# Patient Record
Sex: Female | Born: 1970 | Race: Black or African American | Hispanic: No | Marital: Single | Smoking: Never smoker
Health system: Southern US, Community
[De-identification: ages and names within clinical notes are randomized; demographics above are authoritative.]

## PROBLEM LIST (undated history)

## (undated) DIAGNOSIS — R000 Tachycardia, unspecified: Secondary | ICD-10-CM

## (undated) DIAGNOSIS — K219 Gastro-esophageal reflux disease without esophagitis: Secondary | ICD-10-CM

## (undated) DIAGNOSIS — F32A Depression, unspecified: Secondary | ICD-10-CM

## (undated) DIAGNOSIS — I10 Essential (primary) hypertension: Secondary | ICD-10-CM

## (undated) DIAGNOSIS — G4730 Sleep apnea, unspecified: Secondary | ICD-10-CM

## (undated) DIAGNOSIS — E119 Type 2 diabetes mellitus without complications: Secondary | ICD-10-CM

## (undated) DIAGNOSIS — F419 Anxiety disorder, unspecified: Secondary | ICD-10-CM

## (undated) DIAGNOSIS — Z8719 Personal history of other diseases of the digestive system: Secondary | ICD-10-CM

## (undated) DIAGNOSIS — D649 Anemia, unspecified: Secondary | ICD-10-CM

## (undated) DIAGNOSIS — T84310A Breakdown (mechanical) of electronic bone stimulator, initial encounter: Secondary | ICD-10-CM

## (undated) DIAGNOSIS — G2581 Restless legs syndrome: Secondary | ICD-10-CM

## (undated) DIAGNOSIS — D497 Neoplasm of unspecified behavior of endocrine glands and other parts of nervous system: Secondary | ICD-10-CM

## (undated) DIAGNOSIS — M1990 Unspecified osteoarthritis, unspecified site: Secondary | ICD-10-CM

## (undated) DIAGNOSIS — F4310 Post-traumatic stress disorder, unspecified: Secondary | ICD-10-CM

## (undated) DIAGNOSIS — F329 Major depressive disorder, single episode, unspecified: Secondary | ICD-10-CM

## (undated) DIAGNOSIS — E039 Hypothyroidism, unspecified: Secondary | ICD-10-CM

## (undated) DIAGNOSIS — G43909 Migraine, unspecified, not intractable, without status migrainosus: Secondary | ICD-10-CM

## (undated) HISTORY — PX: HEEL SPUR SURGERY: SHX665

## (undated) HISTORY — PX: TRIGGER FINGER RELEASE: SHX641

## (undated) HISTORY — PX: ABDOMINAL HYSTERECTOMY: SHX81

## (undated) HISTORY — PX: CARPAL TUNNEL RELEASE: SHX101

---

## 2000-07-19 DIAGNOSIS — D352 Benign neoplasm of pituitary gland: Secondary | ICD-10-CM | POA: Insufficient documentation

## 2003-04-17 DIAGNOSIS — R519 Headache, unspecified: Secondary | ICD-10-CM | POA: Insufficient documentation

## 2003-10-12 ENCOUNTER — Emergency Department: Payer: Self-pay | Admitting: Emergency Medicine

## 2003-10-12 ENCOUNTER — Other Ambulatory Visit: Payer: Self-pay

## 2004-08-12 ENCOUNTER — Other Ambulatory Visit: Payer: Self-pay

## 2004-08-12 ENCOUNTER — Emergency Department: Payer: Self-pay | Admitting: Emergency Medicine

## 2005-01-04 HISTORY — PX: BREAST REDUCTION SURGERY: SHX8

## 2005-06-27 ENCOUNTER — Other Ambulatory Visit: Payer: Self-pay

## 2005-06-27 ENCOUNTER — Emergency Department: Payer: Self-pay | Admitting: Emergency Medicine

## 2005-08-14 ENCOUNTER — Emergency Department: Payer: Self-pay | Admitting: Emergency Medicine

## 2005-08-14 ENCOUNTER — Other Ambulatory Visit: Payer: Self-pay

## 2006-09-16 DIAGNOSIS — I10 Essential (primary) hypertension: Secondary | ICD-10-CM | POA: Insufficient documentation

## 2006-09-16 DIAGNOSIS — E039 Hypothyroidism, unspecified: Secondary | ICD-10-CM | POA: Insufficient documentation

## 2006-11-03 DIAGNOSIS — F4310 Post-traumatic stress disorder, unspecified: Secondary | ICD-10-CM | POA: Insufficient documentation

## 2006-11-03 DIAGNOSIS — G4489 Other headache syndrome: Secondary | ICD-10-CM | POA: Insufficient documentation

## 2007-02-05 ENCOUNTER — Emergency Department: Payer: Self-pay | Admitting: Emergency Medicine

## 2007-05-28 ENCOUNTER — Emergency Department: Payer: Self-pay | Admitting: Emergency Medicine

## 2007-06-19 DIAGNOSIS — E559 Vitamin D deficiency, unspecified: Secondary | ICD-10-CM | POA: Insufficient documentation

## 2007-11-16 ENCOUNTER — Ambulatory Visit: Payer: Self-pay

## 2008-03-13 ENCOUNTER — Emergency Department: Payer: Self-pay | Admitting: Emergency Medicine

## 2008-05-07 ENCOUNTER — Emergency Department: Payer: Self-pay | Admitting: Emergency Medicine

## 2008-07-16 ENCOUNTER — Ambulatory Visit: Payer: Self-pay

## 2008-07-31 ENCOUNTER — Ambulatory Visit: Payer: Self-pay

## 2009-05-27 ENCOUNTER — Encounter: Payer: Self-pay | Admitting: Obstetrics and Gynecology

## 2009-06-04 ENCOUNTER — Encounter: Payer: Self-pay | Admitting: Obstetrics and Gynecology

## 2009-07-04 ENCOUNTER — Encounter: Payer: Self-pay | Admitting: Obstetrics and Gynecology

## 2009-08-04 ENCOUNTER — Encounter: Payer: Self-pay | Admitting: Obstetrics and Gynecology

## 2009-09-05 ENCOUNTER — Emergency Department: Payer: Self-pay | Admitting: Emergency Medicine

## 2009-09-07 ENCOUNTER — Emergency Department: Payer: Self-pay | Admitting: Unknown Physician Specialty

## 2010-01-28 HISTORY — PX: INTERSTIM IMPLANT PLACEMENT: SHX5130

## 2010-06-15 ENCOUNTER — Ambulatory Visit: Payer: Self-pay | Admitting: Family Medicine

## 2010-09-01 ENCOUNTER — Ambulatory Visit: Payer: Self-pay | Admitting: Family Medicine

## 2010-11-19 ENCOUNTER — Ambulatory Visit: Payer: Self-pay | Admitting: Nurse Practitioner

## 2010-11-30 DIAGNOSIS — E538 Deficiency of other specified B group vitamins: Secondary | ICD-10-CM | POA: Insufficient documentation

## 2011-01-23 ENCOUNTER — Emergency Department: Payer: Self-pay | Admitting: Emergency Medicine

## 2011-01-23 LAB — CBC WITH DIFFERENTIAL/PLATELET
Basophil #: 0 10*3/uL (ref 0.0–0.1)
Eosinophil #: 0.1 10*3/uL (ref 0.0–0.7)
HGB: 13.2 g/dL (ref 12.0–16.0)
Lymphocyte #: 2.4 10*3/uL (ref 1.0–3.6)
MCH: 30.4 pg (ref 26.0–34.0)
MCHC: 34 g/dL (ref 32.0–36.0)
Monocyte #: 0.5 10*3/uL (ref 0.0–0.7)
Neutrophil #: 2.6 10*3/uL (ref 1.4–6.5)
Neutrophil %: 47.4 %
Platelet: 238 10*3/uL (ref 150–440)
RBC: 4.34 10*6/uL (ref 3.80–5.20)
RDW: 13.2 % (ref 11.5–14.5)

## 2011-01-23 LAB — COMPREHENSIVE METABOLIC PANEL
Anion Gap: 9 (ref 7–16)
Bilirubin,Total: 1 mg/dL (ref 0.2–1.0)
Chloride: 106 mmol/L (ref 98–107)
Co2: 32 mmol/L (ref 21–32)
EGFR (African American): >60
EGFR (Non-African Amer.): >60
Potassium: 3.5 mmol/L (ref 3.5–5.1)
SGOT(AST): 19 U/L (ref 15–37)
SGPT (ALT): 22 U/L

## 2011-01-23 LAB — URINALYSIS, COMPLETE
Bilirubin,UR: NEGATIVE
Blood: NEGATIVE
Ketone: NEGATIVE
Leukocyte Esterase: NEGATIVE
Ph: 7 (ref 4.5–8.0)
Specific Gravity: 1.016 (ref 1.003–1.030)
Squamous Epithelial: <1
WBC UR: <1 /HPF (ref 0–5)

## 2011-03-08 DIAGNOSIS — M5412 Radiculopathy, cervical region: Secondary | ICD-10-CM | POA: Insufficient documentation

## 2011-03-23 ENCOUNTER — Emergency Department: Payer: Self-pay | Admitting: Emergency Medicine

## 2011-03-30 ENCOUNTER — Encounter: Payer: Self-pay | Admitting: Specialist

## 2011-04-05 ENCOUNTER — Encounter: Payer: Self-pay | Admitting: Specialist

## 2011-04-27 DIAGNOSIS — G2581 Restless legs syndrome: Secondary | ICD-10-CM | POA: Insufficient documentation

## 2011-05-05 ENCOUNTER — Encounter: Payer: Self-pay | Admitting: Specialist

## 2011-07-27 ENCOUNTER — Emergency Department: Payer: Self-pay | Admitting: *Deleted

## 2012-01-16 ENCOUNTER — Emergency Department: Payer: Self-pay | Admitting: Emergency Medicine

## 2012-01-16 LAB — URINALYSIS, COMPLETE
Bacteria: NONE SEEN
Blood: NEGATIVE
Glucose,UR: NEGATIVE mg/dL (ref 0–75)
Ketone: NEGATIVE
Leukocyte Esterase: NEGATIVE
Ph: 7 (ref 4.5–8.0)
Protein: NEGATIVE
RBC,UR: <1 /HPF (ref 0–5)
Specific Gravity: 1.011 (ref 1.003–1.030)
WBC UR: 1 /HPF (ref 0–5)

## 2012-01-16 LAB — WET PREP, GENITAL

## 2012-01-16 LAB — COMPREHENSIVE METABOLIC PANEL
Albumin: 3.9 g/dL (ref 3.4–5.0)
Alkaline Phosphatase: 112 U/L (ref 50–136)
Anion Gap: 8 (ref 7–16)
BUN: 9 mg/dL (ref 7–18)
Calcium, Total: 9.1 mg/dL (ref 8.5–10.1)
Co2: 28 mmol/L (ref 21–32)
Creatinine: 0.9 mg/dL (ref 0.60–1.30)
EGFR (Non-African Amer.): >60
Osmolality: 278 (ref 275–301)
Potassium: 3.2 mmol/L — ABNORMAL LOW (ref 3.5–5.1)
SGOT(AST): 18 U/L (ref 15–37)
SGPT (ALT): 22 U/L (ref 12–78)

## 2012-01-16 LAB — CBC
HCT: 41.3 % (ref 35.0–47.0)
MCH: 30 pg (ref 26.0–34.0)
Platelet: 275 10*3/uL (ref 150–440)
RBC: 4.75 10*6/uL (ref 3.80–5.20)
RDW: 13.7 % (ref 11.5–14.5)

## 2012-01-16 LAB — LIPASE, BLOOD: Lipase: 129 U/L (ref 73–393)

## 2012-02-14 ENCOUNTER — Ambulatory Visit: Payer: Self-pay | Admitting: Family Medicine

## 2012-02-26 ENCOUNTER — Emergency Department: Payer: Self-pay | Admitting: Emergency Medicine

## 2012-09-11 ENCOUNTER — Emergency Department: Payer: Self-pay | Admitting: Emergency Medicine

## 2012-09-13 LAB — BETA STREP CULTURE(ARMC)

## 2012-10-21 ENCOUNTER — Emergency Department: Payer: Self-pay | Admitting: Emergency Medicine

## 2012-10-21 LAB — CBC
HCT: 39 % (ref 35.0–47.0)
MCH: 30.1 pg (ref 26.0–34.0)
MCHC: 35 g/dL (ref 32.0–36.0)
MCV: 86 fL (ref 80–100)
Platelet: 242 10*3/uL (ref 150–440)
RBC: 4.54 10*6/uL (ref 3.80–5.20)
WBC: 6.3 10*3/uL (ref 3.6–11.0)

## 2012-10-21 LAB — COMPREHENSIVE METABOLIC PANEL
Albumin: 3.7 g/dL (ref 3.4–5.0)
Alkaline Phosphatase: 121 U/L (ref 50–136)
Anion Gap: 3 — ABNORMAL LOW (ref 7–16)
BUN: 10 mg/dL (ref 7–18)
Bilirubin,Total: 0.9 mg/dL (ref 0.2–1.0)
Calcium, Total: 8.8 mg/dL (ref 8.5–10.1)
Co2: 29 mmol/L (ref 21–32)
EGFR (African American): >60
EGFR (Non-African Amer.): >60
Osmolality: 277 (ref 275–301)
Potassium: 3.7 mmol/L (ref 3.5–5.1)
SGPT (ALT): 18 U/L (ref 12–78)
Sodium: 139 mmol/L (ref 136–145)
Total Protein: 7.6 g/dL (ref 6.4–8.2)

## 2012-10-21 LAB — URINALYSIS, COMPLETE
Blood: NEGATIVE
Glucose,UR: NEGATIVE mg/dL (ref 0–75)
Ketone: NEGATIVE
Leukocyte Esterase: NEGATIVE
Nitrite: NEGATIVE
RBC,UR: 2 /HPF (ref 0–5)
Squamous Epithelial: 2
WBC UR: 1 /HPF (ref 0–5)

## 2012-10-21 LAB — LIPASE, BLOOD: Lipase: 122 U/L (ref 73–393)

## 2012-10-21 LAB — PREGNANCY, URINE: Pregnancy Test, Urine: NEGATIVE m[IU]/mL

## 2013-04-13 ENCOUNTER — Encounter: Payer: Self-pay | Admitting: Obstetrics and Gynecology

## 2013-05-04 ENCOUNTER — Encounter: Payer: Self-pay | Admitting: Obstetrics and Gynecology

## 2013-06-04 ENCOUNTER — Encounter: Payer: Self-pay | Admitting: Obstetrics and Gynecology

## 2013-06-18 ENCOUNTER — Emergency Department: Payer: Self-pay | Admitting: Emergency Medicine

## 2013-06-18 LAB — COMPREHENSIVE METABOLIC PANEL
ALBUMIN: 3.6 g/dL (ref 3.4–5.0)
ALT: 22 U/L (ref 12–78)
ANION GAP: 5 — AB (ref 7–16)
Alkaline Phosphatase: 105 U/L
BUN: 7 mg/dL (ref 7–18)
Bilirubin,Total: 0.9 mg/dL (ref 0.2–1.0)
CHLORIDE: 105 mmol/L (ref 98–107)
Calcium, Total: 8.8 mg/dL (ref 8.5–10.1)
Co2: 30 mmol/L (ref 21–32)
Creatinine: 0.9 mg/dL (ref 0.60–1.30)
EGFR (Non-African Amer.): >60
Glucose: 132 mg/dL — ABNORMAL HIGH (ref 65–99)
Osmolality: 279 (ref 275–301)
POTASSIUM: 3.5 mmol/L (ref 3.5–5.1)
SGOT(AST): 17 U/L (ref 15–37)
Sodium: 140 mmol/L (ref 136–145)
TOTAL PROTEIN: 7.6 g/dL (ref 6.4–8.2)

## 2013-06-18 LAB — CBC WITH DIFFERENTIAL/PLATELET
Basophil #: 0.2 10*3/uL — ABNORMAL HIGH (ref 0.0–0.1)
Basophil %: 4.5 %
EOS ABS: 0.2 10*3/uL (ref 0.0–0.7)
Eosinophil %: 3.1 %
HCT: 41.7 % (ref 35.0–47.0)
HGB: 13.6 g/dL (ref 12.0–16.0)
LYMPHS ABS: 2.3 10*3/uL (ref 1.0–3.6)
Lymphocyte %: 42.4 %
MCH: 29.3 pg (ref 26.0–34.0)
MCHC: 32.7 g/dL (ref 32.0–36.0)
MCV: 90 fL (ref 80–100)
MONO ABS: 0.2 x10 3/mm (ref 0.2–0.9)
Monocyte %: 3.3 %
Neutrophil #: 2.5 10*3/uL (ref 1.4–6.5)
Neutrophil %: 46.7 %
Platelet: 261 10*3/uL (ref 150–440)
RBC: 4.65 10*6/uL (ref 3.80–5.20)
RDW: 13.7 % (ref 11.5–14.5)
WBC: 5.3 10*3/uL (ref 3.6–11.0)

## 2013-06-18 LAB — URINALYSIS, COMPLETE
Bacteria: NONE SEEN
Bilirubin,UR: NEGATIVE
Blood: NEGATIVE
Glucose,UR: NEGATIVE mg/dL (ref 0–75)
KETONE: NEGATIVE
Leukocyte Esterase: NEGATIVE
Nitrite: NEGATIVE
PROTEIN: NEGATIVE
Ph: 7 (ref 4.5–8.0)
RBC,UR: 1 /HPF (ref 0–5)
Specific Gravity: 1.01 (ref 1.003–1.030)
Squamous Epithelial: <1
WBC UR: NONE SEEN /HPF (ref 0–5)

## 2013-06-18 LAB — LIPASE, BLOOD: Lipase: 109 U/L (ref 73–393)

## 2013-06-18 LAB — GC/CHLAMYDIA PROBE AMP

## 2013-06-18 LAB — WET PREP, GENITAL

## 2013-07-04 ENCOUNTER — Encounter: Payer: Self-pay | Admitting: Obstetrics and Gynecology

## 2013-08-04 ENCOUNTER — Encounter: Payer: Self-pay | Admitting: Obstetrics and Gynecology

## 2013-09-22 ENCOUNTER — Emergency Department: Payer: Self-pay | Admitting: Emergency Medicine

## 2013-09-23 LAB — URINALYSIS, COMPLETE
BLOOD: NEGATIVE
Bacteria: NONE SEEN
Bilirubin,UR: NEGATIVE
Glucose,UR: NEGATIVE mg/dL (ref 0–75)
LEUKOCYTE ESTERASE: NEGATIVE
NITRITE: NEGATIVE
PH: 5 (ref 4.5–8.0)
Protein: NEGATIVE
Specific Gravity: 1.021 (ref 1.003–1.030)
Squamous Epithelial: 1
WBC UR: <1 /HPF (ref 0–5)

## 2013-09-23 LAB — COMPREHENSIVE METABOLIC PANEL
Albumin: 3.5 g/dL (ref 3.4–5.0)
Alkaline Phosphatase: 120 U/L — ABNORMAL HIGH
Anion Gap: 9 (ref 7–16)
BUN: 10 mg/dL (ref 7–18)
Bilirubin,Total: 0.4 mg/dL (ref 0.2–1.0)
CALCIUM: 8.5 mg/dL (ref 8.5–10.1)
Chloride: 106 mmol/L (ref 98–107)
Co2: 28 mmol/L (ref 21–32)
Creatinine: 0.96 mg/dL (ref 0.60–1.30)
EGFR (African American): >60
Glucose: 96 mg/dL (ref 65–99)
Osmolality: 284 (ref 275–301)
Potassium: 3.5 mmol/L (ref 3.5–5.1)
SGOT(AST): 19 U/L (ref 15–37)
SGPT (ALT): 22 U/L
SODIUM: 143 mmol/L (ref 136–145)
TOTAL PROTEIN: 7.5 g/dL (ref 6.4–8.2)

## 2013-09-23 LAB — CBC WITH DIFFERENTIAL/PLATELET
BASOS ABS: 0.1 10*3/uL (ref 0.0–0.1)
Basophil %: 0.8 %
Eosinophil #: 0.1 10*3/uL (ref 0.0–0.7)
Eosinophil %: 1.6 %
HCT: 41.5 % (ref 35.0–47.0)
HGB: 13.7 g/dL (ref 12.0–16.0)
LYMPHS ABS: 3.3 10*3/uL (ref 1.0–3.6)
LYMPHS PCT: 44.4 %
MCH: 29.8 pg (ref 26.0–34.0)
MCHC: 33 g/dL (ref 32.0–36.0)
MCV: 90 fL (ref 80–100)
MONOS PCT: 9.3 %
Monocyte #: 0.7 x10 3/mm (ref 0.2–0.9)
NEUTROS ABS: 3.2 10*3/uL (ref 1.4–6.5)
Neutrophil %: 43.9 %
Platelet: 238 10*3/uL (ref 150–440)
RBC: 4.59 10*6/uL (ref 3.80–5.20)
RDW: 13.8 % (ref 11.5–14.5)
WBC: 7.3 10*3/uL (ref 3.6–11.0)

## 2013-09-23 LAB — LIPASE, BLOOD: Lipase: 148 U/L (ref 73–393)

## 2013-09-23 LAB — GC/CHLAMYDIA PROBE AMP

## 2013-09-23 LAB — TROPONIN I: Troponin-I: <0.02 ng/mL

## 2013-09-23 LAB — WET PREP, GENITAL

## 2013-11-12 ENCOUNTER — Observation Stay: Payer: Self-pay | Admitting: Internal Medicine

## 2013-11-12 LAB — URINALYSIS, COMPLETE
BACTERIA: NONE SEEN
Bilirubin,UR: NEGATIVE
Blood: NEGATIVE
Glucose,UR: NEGATIVE mg/dL (ref 0–75)
KETONE: NEGATIVE
LEUKOCYTE ESTERASE: NEGATIVE
Nitrite: NEGATIVE
PH: 6 (ref 4.5–8.0)
Protein: NEGATIVE
RBC, UR: NONE SEEN /HPF (ref 0–5)
Specific Gravity: 1.009 (ref 1.003–1.030)
Squamous Epithelial: NONE SEEN
WBC UR: <1 /HPF (ref 0–5)

## 2013-11-12 LAB — CBC
HCT: 43.7 % (ref 35.0–47.0)
HGB: 14.6 g/dL (ref 12.0–16.0)
MCH: 30.2 pg (ref 26.0–34.0)
MCHC: 33.3 g/dL (ref 32.0–36.0)
MCV: 91 fL (ref 80–100)
Platelet: 254 10*3/uL (ref 150–440)
RBC: 4.82 10*6/uL (ref 3.80–5.20)
RDW: 13.6 % (ref 11.5–14.5)
WBC: 6.7 10*3/uL (ref 3.6–11.0)

## 2013-11-12 LAB — COMPREHENSIVE METABOLIC PANEL
ANION GAP: 8 (ref 7–16)
Albumin: 4.2 g/dL (ref 3.4–5.0)
Alkaline Phosphatase: 116 U/L
BILIRUBIN TOTAL: 1.1 mg/dL — AB (ref 0.2–1.0)
BUN: 12 mg/dL (ref 7–18)
CREATININE: 1.12 mg/dL (ref 0.60–1.30)
Calcium, Total: 9 mg/dL (ref 8.5–10.1)
Chloride: 101 mmol/L (ref 98–107)
Co2: 30 mmol/L (ref 21–32)
GFR CALC NON AF AMER: 56 — AB
Glucose: 115 mg/dL — ABNORMAL HIGH (ref 65–99)
Osmolality: 278 (ref 275–301)
Potassium: 3.4 mmol/L — ABNORMAL LOW (ref 3.5–5.1)
SGOT(AST): 16 U/L (ref 15–37)
SGPT (ALT): 25 U/L
Sodium: 139 mmol/L (ref 136–145)
TOTAL PROTEIN: 8.2 g/dL (ref 6.4–8.2)

## 2013-11-12 LAB — TROPONIN I: Troponin-I: <0.02 ng/mL

## 2013-11-12 LAB — CK TOTAL AND CKMB (NOT AT ARMC)
CK, TOTAL: 271 U/L — AB
CK, Total: 228 U/L — ABNORMAL HIGH
CK, Total: 230 U/L — ABNORMAL HIGH
CK-MB: 0.9 ng/mL (ref 0.5–3.6)
CK-MB: 1 ng/mL (ref 0.5–3.6)
CK-MB: 0.9 ng/mL (ref 0.5–3.6)

## 2013-11-12 LAB — TSH: THYROID STIMULATING HORM: 0.282 u[IU]/mL — AB

## 2013-11-12 LAB — HEMOGLOBIN A1C: HEMOGLOBIN A1C: 5.8 % (ref 4.2–6.3)

## 2013-11-12 LAB — MAGNESIUM: Magnesium: 2 mg/dL

## 2013-11-12 LAB — T4, FREE: Free Thyroxine: 1.5 ng/dL — ABNORMAL HIGH (ref 0.76–1.46)

## 2013-11-12 LAB — D-DIMER(ARMC): D-Dimer: 341 ng/ml

## 2013-11-13 DIAGNOSIS — I361 Nonrheumatic tricuspid (valve) insufficiency: Secondary | ICD-10-CM

## 2013-11-13 LAB — CBC WITH DIFFERENTIAL/PLATELET
Basophil #: 0 10*3/uL (ref 0.0–0.1)
Basophil %: 0.3 %
Eosinophil #: 0.1 10*3/uL (ref 0.0–0.7)
Eosinophil %: 1.3 %
HCT: 41.1 % (ref 35.0–47.0)
HGB: 13.7 g/dL (ref 12.0–16.0)
LYMPHS ABS: 1.7 10*3/uL (ref 1.0–3.6)
Lymphocyte %: 36.5 %
MCH: 30.3 pg (ref 26.0–34.0)
MCHC: 33.3 g/dL (ref 32.0–36.0)
MCV: 91 fL (ref 80–100)
MONOS PCT: 9.9 %
Monocyte #: 0.5 x10 3/mm (ref 0.2–0.9)
NEUTROS PCT: 52 %
Neutrophil #: 2.5 10*3/uL (ref 1.4–6.5)
Platelet: 231 10*3/uL (ref 150–440)
RBC: 4.52 10*6/uL (ref 3.80–5.20)
RDW: 13.8 % (ref 11.5–14.5)
WBC: 4.8 10*3/uL (ref 3.6–11.0)

## 2013-11-13 LAB — COMPREHENSIVE METABOLIC PANEL
Albumin: 3.6 g/dL (ref 3.4–5.0)
Alkaline Phosphatase: 106 U/L
Anion Gap: 9 (ref 7–16)
BUN: 11 mg/dL (ref 7–18)
Bilirubin,Total: 1.3 mg/dL — ABNORMAL HIGH (ref 0.2–1.0)
CHLORIDE: 104 mmol/L (ref 98–107)
CO2: 28 mmol/L (ref 21–32)
Calcium, Total: 8.4 mg/dL — ABNORMAL LOW (ref 8.5–10.1)
Creatinine: 0.87 mg/dL (ref 0.60–1.30)
EGFR (African American): >60
Glucose: 113 mg/dL — ABNORMAL HIGH (ref 65–99)
Osmolality: 281 (ref 275–301)
POTASSIUM: 3.3 mmol/L — AB (ref 3.5–5.1)
SGOT(AST): 20 U/L (ref 15–37)
SGPT (ALT): 21 U/L
Sodium: 141 mmol/L (ref 136–145)
Total Protein: 7.6 g/dL (ref 6.4–8.2)

## 2013-11-13 LAB — MAGNESIUM: Magnesium: 2 mg/dL

## 2013-12-25 ENCOUNTER — Ambulatory Visit: Payer: Self-pay | Admitting: Cardiology

## 2014-03-07 DIAGNOSIS — G4733 Obstructive sleep apnea (adult) (pediatric): Secondary | ICD-10-CM | POA: Insufficient documentation

## 2014-04-14 ENCOUNTER — Emergency Department
Admit: 2014-04-14 | Disposition: A | Discharge status: Home / Self Care | Payer: Self-pay | Admitting: Emergency Medicine

## 2014-04-27 NOTE — H&P (Signed)
PATIENT NAME:  Carol Gomez, Carol Gomez MR#:  [id number] DATE OF BIRTH:  1970-04-08  DATE OF ADMISSION:  11/12/2013  PRIMARY CARE PHYSICIAN: [facility].   HISTORY OF PRESENT ILLNESS:  The patient is a 44 year old African American female with history of diet-controlled diabetes, hypertension, hypothyroidism, obstructive sleep apnea and also other medical multiple problems, who presented to the hospital with complaints of chest pain, headaches, as well as shortness of breath, which are ongoing for the past 3 days. According to the patient, she was doing well up until approximately 3 days ago, on Friday, when she started having palpitations of her heart.  It felt as if her heart was racing.  According to the patient, she was having also chest pains, feeling like cramping sensation in the left side of the chest and pressure sensation in the middle of the chest in the sternal area.  Pain was intermittent, increasing with exertion.  Chest pain was also related to increasing heart beats. The heart beats would go first and then chest pain would occur.  The patient would no radiation.  However, admitted to having some headaches.  On arrival to the Emergency Room, she had an EKG done, which revealed sinus bradycardia at 101; also possible depressions in inferior leads. She was also noted to have heart rate jumping up from 80s to 120s every time she gets up and walks around. Her blood pressure was also noted to be elevated at 326 systolic when she first arrived to the hospital, improved briefly to 120s, but, then jumped again to 712W systolic. Hospitalist services were contacted for admission.   PAST MEDICAL HISTORY: Significant for history of diabetes mellitus, diet-controlled, hypertension, hypothyroidism, obstructive sleep apnea for which she uses CPAP.  The patient tells me that she is not able to lie down on her back since she has very severe obstructive sleep apnea, history of migraine headaches, for  which she takes Baclofen, history of hiatal hernia, gastroesophageal reflux disease, history of stimulator for urinary incontinence, admits to having operations of bilateral tubes and ovaries due to cysts.  Also history of back pain, history of PTSD, cyst on the back operated, also breast reduction surgery.   MEDICATIONS: Multiple, calcium 600 with vitamin D 200 units, 1 tablet once at bedtime, lorazepam 0.5 mg 1 to 2 times a day daily as needed, Cymbalta 30 mg capsules, 3 capsules at bedtime, Dulcolax laxative 5 mg every 3 days as needed, gabapentin 800 mg 5 tablets at bedtime, hydralazine 50 mg 3 times daily, hydrochlorothiazide 25 mg p.o. daily, lactulose 10 grams in 15 mL oral syrup 4 times daily as needed, Levoxyl 150 mcg p.o. daily, magnesium citrate 300 mg 1 daily as needed, Norvasc 10 mg p.o. daily, Provigil 200 mg 1 tablet daily, Requip 0.25 mg p.o. at bedtime, vitamin B-12, 1000 mcg once p.o. daily and vitamin D3 1000 units once daily. The patient is also on baclofen 10 mg daily as needed.   FAMILY HISTORY: The patient's brother had a heart attack in his 85s, hypertension, aunt had diabetes mellitus. The patient's mother has hypertension, also breast cancer in the patient's mom sister.   SOCIAL HISTORY: The patient is divorced, has three children, used to chew tobacco, quit approximately 2 years ago.  No alcohol abuse. Used to work in [employer] where she was exposed to some dust, also a Probation officer.   REVIEW OF SYSTEMS: Positive for weakness, pains in the chest, some blurring of vision and headaches, bifocal glasses,  sinus drainage, some cough intermittently with no significant sputum production edema in the lower extremities in the feet intermittently, also, arrhythmias, and dyspnea on exertion, feeling presyncopal and fall yesterday into the tub and injury in the right side of the hip area, intermittent nausea with headaches and constipation for which she takes multiple medications.   Also urinary incontinence for which she has stimulator implanted, hip arthritis. Denies any fevers, chills, fatigue, weight loss or gain.  EYES: Denies any double vision, glaucoma or cataracts.  ENT:  Denies any tinnitus, allergies, epistaxis, sinus pain, dentures, difficulty swallowing. RESPIRATORY:  Denies any wheezes, asthma, COPD.  CARDIOVASCULAR: Denies any orthopnea, palpitations except as mentioned above.  GASTROINTESTINAL: Denies any diarrhea, abdominal pain, rectal bleeding change in bowel habits. Admits to having some chronic left lower quadrant abdominal pain after her ovary and tube operations.  Denies any dysuria, hematuria, frequency. ENDOCRINE:  Denies any polydipsia, nocturia, thyroid problems, heat or cold intolerance or thirst.  HEMATOLOGIC: Denies easy bruising, bleeding anemic.  SKIN: Denies acne, rashes, lesions, or change in moles.  MUSCULOSKELETAL: Denies arthritis, cramps, swelling.  NEUROLOGIC: Denies numbness, epilepsy or tremor.  PSYCHIATRIC: Denies anxiety, insomnia, or depression. The patient admits to having some bilateral numbness in her fingertips.  PHYSICAL EXAMINATION:  VITAL SIGNS:  Temperature was 97.9, pulse was 108, respiration was 18, blood pressure 171/92, saturation was 98% on room air.  GENERAL: This is a well-developed, well-nourished, obese, African American female in no significant distress, lying on the stretcher.  HEENT: Her pupils equal, reactive to light. Extraocular movements intact, no icterus or conjunctivitis. Has normal hearing. No pharyngeal erythema. Mucosa is dry.  NECK: No masses. Supple, nontender. Thyroid is not enlarged. No adenopathy. No JVD or carotid bruits bilaterally. Full range of motion.  LUNGS: Clear to auscultation, though diminished breath sounds. No rales, rhonchi, or wheezing. No labored inspirations, increased effort is to percussion or overt respiratory distress.  CARDIOVASCULAR: S1, S2 appreciated. Rhythm is regular. PMI  not lateralized. Chest is nontender to palpation. Pedal pulses 1+, no lower extremity edema, calf tenderness or cyanosis was noted.  ABDOMEN:  Soft, tender in the left lower quadrant, but no rebound or guarding were noted. No masses. No hepatosplenomegaly or masses were noted.  RECTAL: Deferred.  MUSCLE STRENGTH: Able to move all extremities. No cyanosis, degenerative joint disease or kyphosis.  Gait not tested.  SKIN: Did not reveal any rashes, lesions, erythema, nodularity or induration. It was warm and dry to palpation.  LYMPHATIC: No adenopathy in the cervical region.  NEUROLOGIC: Cranial nerves grossly intact. Sensory is intact.  No dysarthria or aphasia. The patient is alert, oriented to time, person and place, cooperative. Memory is good.  PSYCHIATRIC: No significant confusion, agitation, or depression noted.   DIAGNOSTIC DATA:  EKG done in Emergency Room showed sinus tachycardia at 101 beats per minute.  Possible left atrial enlargement, LVH per EKG criteria, ST depressions in inferior leads.  Questionable septal infarct according to EKG criteria, age indeterminate QS in V2. Nonspecific ST-T changes in lateral leads.  LABORATORY DATA:  Glucose 115, potassium 3.4, otherwise BMP was unremarkable. The patient's total bilirubin was elevated at 1.1, otherwise unremarkable. Liver enzymes troponin is less than 0.02. White blood cell count is normal at 6.7, hemoglobin was 14.6, platelet count 254,000.  D-dimer is normal at 341. Urinalysis negative for glucose, bilirubin or ketones. Specific gravity was 1.009, pH was 6.0, negative for blood, protein, nitrites or leukocyte esterase, no red blood cells, less than 1 white  blood cells. No bacteria or epithelial cells. Mucus was present.   RADIOLOGIC STUDIES: Chest x-ray, portable single view 11/12/2013 revealed shallow inspiration, but no edema or definite pneumonia.   ASSESSMENT AND PLAN:  1.  Palpitations of unclear etiology at this time.  Admit the  patient to the medical floor. Very likely the patient's palpitations are sinus tachycardia.  We will get cardiology consultation. We will get TSH done and ABGs to rule out acidosis.  Her D-dimer is okay.  The patient may benefit from CT scan of IV contrast to rule out pulmonary embolism despite a D-dimer being normal.   2.  Chest pain.  We will get cardiac enzymes x 3. We will get cardiology involved. The patient already had a comprehensive cardiac work-up about 1 year ago at [facility].  We will get medical records from there. 3.  Headache, likely due to elevated blood pressure as the patient's arrival blood pressure was 170 now 150; however, cannot rule out also migraine headaches.  4.  Hypertension. We will continue her medications. We will add beta blocker.   TIME SPENT: 50 minutes on the patient.     ____________________________ Theodoro Grist, MD rv:DT D: 11/12/2013 15:44:00 ET T: 11/12/2013 16:20:18 ET JOB#: [id number]  cc: Theodoro Grist, MD, <Dictator> [facility] [place] MD ELECTRONICALLY SIGNED 12/16/2013 16:52

## 2014-04-27 NOTE — Consult Note (Signed)
PATIENT NAME:  CHARLANN, WAYNE MR#:  [id number] DATE OF BIRTH:  04/13/1970  DATE OF CONSULTATION:  11/13/2013  REFERRING PHYSICIAN:   CONSULTING PHYSICIAN:  Isaias Cowman, MD  CHIEF COMPLAINT: "My heart rate was up."  HISTORY OF PRESENT ILLNESS: The patient is a 44 year old female who is admitted with tachycardia and chest discomfort. The patient has a history of diabetes, hypertension and obstructive sleep apnea. She was seen by her primary care provider yesterday and was noted to be tachycardic, sent to [facility] Emergency Room. In the Emergency Room, EKG revealed sinus tachycardia at a rate of 101 bpm. The patient had elevated blood pressure with initial systolic of 329. The patient was admitted to telemetry where she has ruled out for myocardial infarction by CPK, isoenzymes, and troponin. According to the patient, she underwent recent cardiology work-up including a stress test approximately a year ago at [facility], which was unremarkable. The patient currently denies chest pain. The patient was started on metoprolol. Heart rate and blood pressure are much better controlled today.   PAST MEDICAL HISTORY: 1.  Hypertension.  2.  Diabetes.  3.  Obstructive sleep apnea, on CPAP.  4.  Hypothyroidism.   MEDICATIONS ON ADMISSION: Hydrochlorothiazide 25 mg daily, amlodipine 10 mg daily, calcium 600 with vitamin D 1 daily, lorazepam 0.5 mg 1 to 2 daily p.r.n., Cymbalta 30 mg 3 capsules at bedtime, Dulcolax 5 mg every 3 days p.r.n., gabapentin 800 mg tablets 5 tablets every p.m., hydralazine 50 mg t.i.d., lactulose 10 grams/15 mL q.i.d. p.r.n., Levoxyl 150 mcg daily, magnesium citrate 300 mg daily, Provigil 200 mg daily, Requip 0.25 mg at bedtime, vitamin B12 1000 mcg daily.   SOCIAL HISTORY: The patient is divorced. She has 3 children. She currently lives alone.   FAMILY HISTORY: Brother status post MI in his 55s.   REVIEW OF SYSTEMS: CONSTITUTIONAL: The patient does complain of  generalized weakness.  EYES: No blurry vision.  EARS: No hearing loss.  RESPIRATORY: The patient has exertional dyspnea.  CARDIOVASCULAR: The patient has chest pain described as chest pressure with exertion and tachycardia.  GASTROINTESTINAL: The patient has constipation.  GENITOURINARY: No dysuria or hematuria.  ENDOCRINE: No polyuria or polydipsia.  MUSCULOSKELETAL: No arthralgias or myalgias.  NEUROLOGICAL: No focal muscle weakness or numbness.  PSYCHOLOGICAL: No depression or anxiety.   PHYSICAL EXAMINATION: VITAL SIGNS: Blood pressure is 119/73, pulse 74, respirations 20, temperature 97.3, pulse ox 97%.  HEENT: Pupils equal and reactive to light and accommodation.  NECK: Supple without thyromegaly.  LUNGS: Clear.  HEART: Normal JVP. Normal PMI. Regular rate and rhythm. Normal S1, S2. No appreciable gallop, murmur, or rub.  ABDOMEN: Soft and nontender. Pulses were intact bilaterally.  MUSCULOSKELETAL: Normal muscle tone.  NEUROLOGIC: The patient is alert and oriented x3. Motor and sensory are both grossly intact.   IMPRESSION: A 44 year old female who presents with sinus tachycardia, which is improved on metoprolol. Blood pressure is elevated, which is also improved. The patient has had chest discomfort with atypical features with negative troponin. The patient has had recent previous cardiac evaluation which reportedly was negative.   RECOMMENDATIONS: 1.  Agree with overall current therapy.  2.  Continue metoprolol for blood pressure and heart rate control.  3.  Review 2-D echocardiogram.  4.  Hopefully obtain records from [facility] regarding recent cardiac evaluation.  5.  I am hesitant to repeat another functional study at this time. If the patient does well today, ambulates without difficulty, consider discharge home.  ____________________________ Isaias Cowman, MD ap:sb D: 11/13/2013 07:38:12 ET T: 11/13/2013 07:48:28 ET JOB#: [id number]  cc: Isaias Cowman, MD, <Dictator> Isaias Cowman MD ELECTRONICALLY SIGNED 11/16/2013 8:40

## 2014-04-27 NOTE — Discharge Summary (Signed)
PATIENT NAME:  Carol Gomez, Carol Gomez MR#:  [id number] DATE OF BIRTH:  1970/11/30  PRIMARY CARE PHYSICIAN: Sharee Pimple, MD  DISCHARGE DIAGNOSES: 1.  Palpitation. 2.  Malignant hypertension.  3.  Diabetes.  4.  Hypothyroidism with elevated T4 and suppressed TSH.  5.  Obstructive sleep apnea.  6.  Obesity.   CONDITION: Stable.   CODE STATUS: Full code.   HOME MEDICATIONS: Please refer to the medication reconciliation list.   DIET: Low-sodium, low-fat, low-cholesterol diet.   ACTIVITY: As tolerated.   FOLLOWUP CARE:  With PCP and Dr. Saralyn Pilar within 1-2 weeks.   REASON FOR ADMISSION: Chest pain, headache, and shortness of breath for 3 days.   HOSPITAL COURSE: The patient is a 44 year old African American female with a history of history of hypertension, diabetes, hypothyroidism, OSA, who presented to the ED with chest pain, headache, and shortness of breath. In addition, the patient has palpitation,. Her heart rate jumped up from 80s to 120s every time she gets up and walks around. Blood pressure was elevated at 170. For detailed history and physical examination, please refer to the admission note dictated by Dr. Ether Griffins. The patient's EKG shows sinus tachycardia at 101 BPM.   LABORATORY DATA: The patient's potassium was low at 3.4. Otherwise, electrolytes are normal. CBC is normal. TSH 0.282, free T4 1.5, which is elevated. The patient's D-dimer is negative.   1.  For palpitation, chest pain, the patient got cardiac enzymes, which were negative. In addition, the patient got an echocardiograph, which is normal. Dr. Saralyn Pilar evaluated patient and suggested patient may follow up as outpatient. 2.  Hypothyroidism with suppressed TSH and elevated free T4. Synthroid was discontinued. The patient needs to follow up TSH and T4 with PCP. Palpitation is possible due to elevated free T4.  3.  Malignant hypertension. The patient has been treated with patient's hypertension medication.  Lopressor was  added.  The patient's blood pressure is under control.  4.  The patient has no complaints except palpitation when walking. Vital signs are stable.   Physical examination is unremarkable. The patient is clinically stable and will be discharged home today.  Discussed with the patient discharge plan, with the patient's nurse, case manager, and Dr. Saralyn Pilar.   TIME SPENT:  About 35 minutes.   ____________________________ Demetrios Loll, MD qc:LT D: 11/13/2013 14:07:00 ET T: 11/13/2013 18:32:54 ET JOB#: [id number]  cc: Demetrios Loll, MD, <Dictator> Demetrios Loll MD ELECTRONICALLY SIGNED 11/14/2013 10:43

## 2014-11-03 ENCOUNTER — Emergency Department
Admission: EM | Admit: 2014-11-03 | Discharge: 2014-11-03 | Disposition: A | Discharge status: Home / Self Care | Payer: Medicare Other | Attending: Emergency Medicine | Admitting: Emergency Medicine

## 2014-11-03 ENCOUNTER — Encounter: Payer: Self-pay | Admitting: Emergency Medicine

## 2014-11-03 DIAGNOSIS — Z79899 Other long term (current) drug therapy: Secondary | ICD-10-CM | POA: Diagnosis not present

## 2014-11-03 DIAGNOSIS — R42 Dizziness and giddiness: Secondary | ICD-10-CM | POA: Diagnosis not present

## 2014-11-03 DIAGNOSIS — R300 Dysuria: Secondary | ICD-10-CM | POA: Insufficient documentation

## 2014-11-03 DIAGNOSIS — M25561 Pain in right knee: Secondary | ICD-10-CM | POA: Insufficient documentation

## 2014-11-03 DIAGNOSIS — I10 Essential (primary) hypertension: Secondary | ICD-10-CM | POA: Insufficient documentation

## 2014-11-03 DIAGNOSIS — E119 Type 2 diabetes mellitus without complications: Secondary | ICD-10-CM | POA: Diagnosis not present

## 2014-11-03 HISTORY — DX: Gastro-esophageal reflux disease without esophagitis: K21.9

## 2014-11-03 HISTORY — DX: Sleep apnea, unspecified: G47.30

## 2014-11-03 HISTORY — DX: Migraine, unspecified, not intractable, without status migrainosus: G43.909

## 2014-11-03 HISTORY — DX: Type 2 diabetes mellitus without complications: E11.9

## 2014-11-03 HISTORY — DX: Neoplasm of unspecified behavior of endocrine glands and other parts of nervous system: D49.7

## 2014-11-03 HISTORY — DX: Breakdown (mechanical) of electronic bone stimulator, initial encounter: T84.310A

## 2014-11-03 HISTORY — DX: Essential (primary) hypertension: I10

## 2014-11-03 LAB — BASIC METABOLIC PANEL
Anion gap: 8 (ref 5–15)
BUN: 11 mg/dL (ref 6–20)
CALCIUM: 8.7 mg/dL — AB (ref 8.9–10.3)
CO2: 28 mmol/L (ref 22–32)
CREATININE: 0.88 mg/dL (ref 0.44–1.00)
Chloride: 105 mmol/L (ref 101–111)
GFR calc Af Amer: >60 mL/min (ref >60)
GLUCOSE: 115 mg/dL — AB (ref 65–99)
Potassium: 3.4 mmol/L — ABNORMAL LOW (ref 3.5–5.1)
SODIUM: 141 mmol/L (ref 135–145)

## 2014-11-03 LAB — URINALYSIS COMPLETE WITH MICROSCOPIC (ARMC ONLY)
BACTERIA UA: NONE SEEN
BILIRUBIN URINE: NEGATIVE
GLUCOSE, UA: NEGATIVE mg/dL
HGB URINE DIPSTICK: NEGATIVE
KETONES UR: NEGATIVE mg/dL
Leukocytes, UA: NEGATIVE
NITRITE: NEGATIVE
Protein, ur: NEGATIVE mg/dL
Specific Gravity, Urine: 1.009 (ref 1.005–1.030)
pH: 8 (ref 5.0–8.0)

## 2014-11-03 LAB — CBC
HEMATOCRIT: 38.3 % (ref 35.0–47.0)
Hemoglobin: 13.1 g/dL (ref 12.0–16.0)
MCH: 30.1 pg (ref 26.0–34.0)
MCHC: 34.3 g/dL (ref 32.0–36.0)
MCV: 87.7 fL (ref 80.0–100.0)
PLATELETS: 232 10*3/uL (ref 150–440)
RBC: 4.37 MIL/uL (ref 3.80–5.20)
RDW: 13.2 % (ref 11.5–14.5)
WBC: 6.8 10*3/uL (ref 3.6–11.0)

## 2014-11-03 MED ORDER — MECLIZINE HCL 25 MG PO TABS: 25.0000 mg | ORAL_TABLET | Freq: Three times a day (TID) | ORAL | Status: DC | PRN

## 2014-11-03 MED ORDER — IBUPROFEN 600 MG PO TABS: 600.0000 mg | ORAL_TABLET | Freq: Three times a day (TID) | ORAL | Status: DC | PRN

## 2014-11-03 NOTE — ED Notes (Signed)
Right knee pain x 3 days and started feeling dizzy today.  Urinating frequently x 1 day.

## 2014-11-03 NOTE — ED Provider Notes (Signed)
[facility] Emergency Department Provider Note  ____________________________________________  Time seen: 0720  I have reviewed the triage vital signs and the nursing notes.   HISTORY  Chief Complaint Knee Pain and Dizziness   History limited by: Not Limited   HPI Carol Gomez is a 44 y.o. female who presents to the emergency department today with myriad multiple complaints. She complains of right knee pain. She states that this is been going on for 3 days. It is gradually gotten worse. She has noticed a little associated swelling. It is worse when she stands up. She states she has had swelling to that knee in the past. Secondary complaint is for dizziness. She states this started last night. She states that the sensation of being off balance. It is not that she feels like she is given a faint. It is worse when she gets up. Better when she lies down. She states she has had the symptoms before usually when her potassium gets very low. Her third and final complaint is for urinary frequency. She does have some associated dysuria. She denies any abdominal pain or fevers. Denies any bad odor.    Past Medical History  Diagnosis Date  . Hypertension   . Diabetes mellitus without complication ([place])   . Sleep apnea   . GERD (gastroesophageal reflux disease)   . Pituitary tumor ([place])   . Migraine   . Mechanical breakdown of electronic bone stimulator ([place])   . Hiatal hernia     There are no active problems to display for this patient.   History reviewed. No pertinent past surgical history.  Current Outpatient Rx  Name  Route  Sig  Dispense  Refill  . amLODipine (NORVASC) 10 MG tablet   Oral   Take 10 mg by mouth daily. for high blood pressure      0   . baclofen (LIORESAL) 10 MG tablet   Oral   Take 1 tablet by mouth 3 (three) times daily.      1   . BELSOMRA 20 MG TABS   Oral   Take 1 tablet by mouth at bedtime.      0     Dispense as  written.   . clonazePAM (KLONOPIN) 0.5 MG tablet   Oral   Take 1 tablet by mouth daily.      0   . DULoxetine (CYMBALTA) 60 MG capsule   Oral   Take 1 capsule by mouth 2 (two) times daily.      0   . HORIZANT 600 MG TBCR   Oral   Take 1 tablet by mouth daily.      1     Dispense as written.   . hydrochlorothiazide (HYDRODIURIL) 25 MG tablet   Oral   Take 1 tablet by mouth daily.      0   . levothyroxine (SYNTHROID, LEVOTHROID) 125 MCG tablet   Oral   Take 1 tablet by mouth daily.      0   . modafinil (PROVIGIL) 200 MG tablet   Oral   Take 1 tablet by mouth 2 (two) times daily.      0   . oxyCODONE (OXY IR/ROXICODONE) 5 MG immediate release tablet   Oral   Take 1 tablet by mouth every 4 (four) hours as needed.      0   . rOPINIRole (REQUIP) 0.25 MG tablet   Oral   Take 2 tablets by mouth daily.  0   . tiZANidine (ZANAFLEX) 4 MG tablet   Oral   Take 1 tablet by mouth 2 (two) times daily.      0   . Vitamin D, Ergocalciferol, (DRISDOL) 50000 UNITS CAPS capsule   Oral   Take 2 capsules by mouth once a week.      0     Allergies Dihydroergotamine  No family history on file.  Social History Social History  Substance Use Topics  . Smoking status: Never Smoker   . Smokeless tobacco: Former Systems developer  . Alcohol Use: No    Review of Systems  Constitutional: Negative for fever. Cardiovascular: Negative for chest pain. Respiratory: Negative for shortness of breath. Gastrointestinal: Negative for abdominal pain, vomiting and diarrhea. Genitourinary: Positive for dysuria Musculoskeletal: Negative for back pain. Positive for right knee pain Skin: Negative for rash. Neurological: Positive for dizziness  10-point ROS otherwise negative.  ____________________________________________   PHYSICAL EXAM:  VITAL SIGNS: ED Triage Vitals  Enc Vitals Group     BP 11/03/14 0235 168/97 mmHg     Pulse Rate 11/03/14 0235 73     Resp 11/03/14 0235  16     Temp 11/03/14 0235 97.9 F (36.6 C)     Temp Source 11/03/14 0235 Oral     SpO2 11/03/14 0235 98 %     Weight 11/03/14 0235 175 lb (79.379 kg)     Height 11/03/14 0235 5' 1"  (1.549 m)     Head Cir --      Peak Flow --      Pain Score 11/03/14 0237 10   Constitutional: Alert and oriented. Gomez appearing and in no distress. Eyes: Conjunctivae are normal. PERRL. Normal extraocular movements. ENT   Head: Normocephalic and atraumatic.   Nose: No congestion/rhinnorhea.   Mouth/Throat: Mucous membranes are moist.   Neck: No stridor. Hematological/Lymphatic/Immunilogical: No cervical lymphadenopathy. Cardiovascular: Normal rate, regular rhythm.  No murmurs, rubs, or gallops. Respiratory: Normal respiratory effort without tachypnea nor retractions. Breath sounds are clear and equal bilaterally. No wheezes/rales/rhonchi. Gastrointestinal: Soft and nontender. No distention.  Genitourinary: Deferred Musculoskeletal: Normal range of motion in all extremities. No joint effusions.  No lower extremity tenderness nor edema. Full range of motion of the right knee without any tenderness. No tenderness to palpation. No appreciable swelling. No erythema. No warmth. Neurologic:  Normal speech and language. No gross focal neurologic deficits are appreciated. Speech is normal. Romberg normal. Skin:  Skin is warm, dry and intact. No rash noted. Psychiatric: Mood and affect are normal. Speech and behavior are normal. Patient exhibits appropriate insight and judgment.  ____________________________________________    LABS (pertinent positives/negatives)  Labs Reviewed  BASIC METABOLIC PANEL - Abnormal; Notable for the following:    Potassium 3.4 (*)    Glucose, Bld 115 (*)    Calcium 8.7 (*)    All other components within normal limits  URINALYSIS COMPLETEWITH MICROSCOPIC ([facility] ONLY) - Abnormal; Notable for the following:    Color, Urine STRAW (*)    APPearance CLEAR (*)    Squamous  Epithelial / LPF 0-5 (*)    All other components within normal limits  CBC  CBG MONITORING, ED     ____________________________________________   EKG  None  ____________________________________________    RADIOLOGY  None   ____________________________________________   PROCEDURES  Procedure(s) performed: None  Critical Care performed: No  ____________________________________________   INITIAL IMPRESSION / ASSESSMENT AND PLAN / ED COURSE  Pertinent labs & imaging results that were available during  my care of the patient were reviewed by me and considered in my medical decision making (see chart for details).  Patient presents to the emergency department today with myriad multiple medical complaints. In terms of her knee I doubt crystalline arthropathy or infected joint given the lack of any appreciable swelling, warmth, erythema or tenderness to range of motion testing. Think more likely arthritis. Patient does stand quite a bit at work. In terms of her urinary frequency her urine does not show any signs of UTI. In terms of her dizziness. Patient was able to stand up easily. No obvious nystagmus. I doubt posterior etiology. Will plan on giving meclizine.  ____________________________________________   FINAL CLINICAL IMPRESSION(S) / ED DIAGNOSES  Final diagnoses:  Right knee pain  Dizziness     Nance Pear, MD 11/03/14 [phone]

## 2014-11-03 NOTE — ED Notes (Signed)
Report given to Alicia, RN

## 2014-11-03 NOTE — ED Notes (Signed)
Discussed discharge instructions, prescriptions, and follow-up care with patient. No questions or concerns at this time. Pt stable at discharge.  

## 2014-11-03 NOTE — Discharge Instructions (Signed)
Please seek medical attention for any high fevers, chest pain, shortness of breath, change in behavior, persistent vomiting, bloody stool or any other new or concerning symptoms. ° ° °Joint Pain °Joint pain, which is also called arthralgia, can be caused by many things. Joint pain often goes away when you follow your health care provider's instructions for relieving pain at home. However, joint pain can also be caused by conditions that require further treatment. Common causes of joint pain include: °· Bruising in the area of the joint. °· Overuse of the joint. °· Wear and tear on the joints that occur with aging (osteoarthritis). °· Various other forms of arthritis. °· A buildup of a crystal form of uric acid in the joint (gout). °· Infections of the joint (septic arthritis) or of the bone (osteomyelitis). °Your health care provider may recommend medicine to help with the pain. If your joint pain continues, additional tests may be needed to diagnose your condition. °HOME CARE INSTRUCTIONS °Watch your condition for any changes. Follow these instructions as directed to lessen the pain that you are feeling. °· Take medicines only as directed by your health care provider. °· Rest the affected area for as long as your health care provider says that you should. If directed to do so, raise the painful joint above the level of your heart while you are sitting or lying down. °· Do not do things that cause or worsen pain. °· If directed, apply ice to the painful area: °¨ Put ice in a plastic bag. °¨ Place a towel between your skin and the bag. °¨ Leave the ice on for 20 minutes, 2-3 times per day. °· Wear an elastic bandage, splint, or sling as directed by your health care provider. Loosen the elastic bandage or splint if your fingers or toes become numb and tingle, or if they turn cold and blue. °· Begin exercising or stretching the affected area as directed by your health care provider. Ask your health care provider what  types of exercise are safe for you. °· Keep all follow-up visits as directed by your health care provider. This is important. °SEEK MEDICAL CARE IF: °· Your pain increases, and medicine does not help. °· Your joint pain does not improve within 3 days. °· You have increased bruising or swelling. °· You have a fever. °· You lose 10 lb (4.5 kg) or more without trying. °SEEK IMMEDIATE MEDICAL CARE IF: °· You are not able to move the joint. °· Your fingers or toes become numb or they turn cold and blue. °  °This information is not intended to replace advice given to you by your health care provider. Make sure you discuss any questions you have with your health care provider. °  °Document Released: 12/21/2004 Document Revised: 01/11/2014 Document Reviewed: 10/02/2013 °Elsevier Interactive Patient Education ©2016 Else[employer]. ° °

## 2015-07-01 ENCOUNTER — Encounter: Payer: Self-pay | Admitting: Radiology

## 2015-07-01 ENCOUNTER — Emergency Department
Admission: EM | Admit: 2015-07-01 | Discharge: 2015-07-01 | Disposition: A | Discharge status: Home / Self Care | Payer: Medicare Other | Attending: Emergency Medicine | Admitting: Emergency Medicine

## 2015-07-01 ENCOUNTER — Emergency Department: Payer: Medicare Other

## 2015-07-01 DIAGNOSIS — Z87891 Personal history of nicotine dependence: Secondary | ICD-10-CM | POA: Diagnosis not present

## 2015-07-01 DIAGNOSIS — R1030 Lower abdominal pain, unspecified: Secondary | ICD-10-CM

## 2015-07-01 DIAGNOSIS — I10 Essential (primary) hypertension: Secondary | ICD-10-CM | POA: Insufficient documentation

## 2015-07-01 DIAGNOSIS — Z79899 Other long term (current) drug therapy: Secondary | ICD-10-CM | POA: Diagnosis not present

## 2015-07-01 DIAGNOSIS — E119 Type 2 diabetes mellitus without complications: Secondary | ICD-10-CM | POA: Diagnosis not present

## 2015-07-01 DIAGNOSIS — Z8639 Personal history of other endocrine, nutritional and metabolic disease: Secondary | ICD-10-CM | POA: Insufficient documentation

## 2015-07-01 DIAGNOSIS — R1031 Right lower quadrant pain: Secondary | ICD-10-CM | POA: Insufficient documentation

## 2015-07-01 DIAGNOSIS — Z791 Long term (current) use of non-steroidal anti-inflammatories (NSAID): Secondary | ICD-10-CM | POA: Diagnosis not present

## 2015-07-01 DIAGNOSIS — Z8719 Personal history of other diseases of the digestive system: Secondary | ICD-10-CM | POA: Insufficient documentation

## 2015-07-01 LAB — URINALYSIS COMPLETE WITH MICROSCOPIC (ARMC ONLY)
Bacteria, UA: NONE SEEN
Bilirubin Urine: NEGATIVE
Glucose, UA: NEGATIVE mg/dL
Hgb urine dipstick: NEGATIVE
KETONES UR: NEGATIVE mg/dL
Leukocytes, UA: NEGATIVE
NITRITE: NEGATIVE
PH: 6 (ref 5.0–8.0)
PROTEIN: NEGATIVE mg/dL
RBC / HPF: NONE SEEN RBC/hpf (ref 0–5)
Specific Gravity, Urine: 1.017 (ref 1.005–1.030)

## 2015-07-01 LAB — CBC WITH DIFFERENTIAL/PLATELET
BASOS ABS: 0 10*3/uL (ref 0–0.1)
BASOS PCT: 0 %
EOS PCT: 2 %
Eosinophils Absolute: 0.1 10*3/uL (ref 0–0.7)
HEMATOCRIT: 38.3 % (ref 35.0–47.0)
Hemoglobin: 13.4 g/dL (ref 12.0–16.0)
Lymphocytes Relative: 37 %
Lymphs Abs: 2.5 10*3/uL (ref 1.0–3.6)
MCH: 29.9 pg (ref 26.0–34.0)
MCHC: 35.1 g/dL (ref 32.0–36.0)
MCV: 85.1 fL (ref 80.0–100.0)
MONO ABS: 0.6 10*3/uL (ref 0.2–0.9)
Monocytes Relative: 9 %
NEUTROS ABS: 3.5 10*3/uL (ref 1.4–6.5)
Neutrophils Relative %: 52 %
PLATELETS: 232 10*3/uL (ref 150–440)
RBC: 4.5 MIL/uL (ref 3.80–5.20)
RDW: 12.9 % (ref 11.5–14.5)
WBC: 6.7 10*3/uL (ref 3.6–11.0)

## 2015-07-01 LAB — COMPREHENSIVE METABOLIC PANEL
ALK PHOS: 84 U/L (ref 38–126)
ALT: 18 U/L (ref 14–54)
ANION GAP: 8 (ref 5–15)
AST: 18 U/L (ref 15–41)
Albumin: 4.1 g/dL (ref 3.5–5.0)
BUN: 15 mg/dL (ref 6–20)
CALCIUM: 9.2 mg/dL (ref 8.9–10.3)
CHLORIDE: 103 mmol/L (ref 101–111)
CO2: 29 mmol/L (ref 22–32)
CREATININE: 0.75 mg/dL (ref 0.44–1.00)
Glucose, Bld: 105 mg/dL — ABNORMAL HIGH (ref 65–99)
Potassium: 3.9 mmol/L (ref 3.5–5.1)
SODIUM: 140 mmol/L (ref 135–145)
Total Bilirubin: 1 mg/dL (ref 0.3–1.2)
Total Protein: 7.5 g/dL (ref 6.5–8.1)

## 2015-07-01 MED ORDER — KETOROLAC TROMETHAMINE 30 MG/ML IJ SOLN
30.0000 mg | Freq: Once | INTRAMUSCULAR | Status: AC
  Administered 2015-07-01: 30 mg via INTRAVENOUS
  Filled 2015-07-01: qty 1

## 2015-07-01 MED ORDER — DIATRIZOATE MEGLUMINE & SODIUM 66-10 % PO SOLN
15.0000 mL | Freq: Once | ORAL | Status: AC
  Administered 2015-07-01: 15 mL via ORAL

## 2015-07-01 MED ORDER — IOPAMIDOL (ISOVUE-300) INJECTION 61%
100.0000 mL | Freq: Once | INTRAVENOUS | Status: AC | PRN
  Administered 2015-07-01: 100 mL via INTRAVENOUS

## 2015-07-01 NOTE — ED Notes (Signed)
Pt resting in bed, pt given ice pack for her belly per request, watching TV, pt in no acute distress

## 2015-07-01 NOTE — ED Notes (Signed)
Pt returned from CT, resting in bed, family at bedside 

## 2015-07-01 NOTE — ED Notes (Signed)
Patient transported to CT 

## 2015-07-01 NOTE — ED Notes (Signed)
Pt here from home with c/o RLQ pain; hx of abdominal masses from [place] last month, is f/u with PCP. Pt reports PCP out of town until July 10th; reports ice and ibuprofen isn't helping.

## 2015-07-01 NOTE — ED Notes (Signed)
Pt resting in bed, eyes closed, resp even and unlabored

## 2015-07-01 NOTE — ED Notes (Signed)
EDP at bedside  

## 2015-07-01 NOTE — Discharge Instructions (Signed)
You were evaluated for lower abdominal pain, and although no certain cause was found, your exam and evaluation are reassuring in the emergency department today.  Return to the emergency department any worsening condition including any worsening abdominal pain, fever, vomiting, or any other symptoms concerning to you.  The CT scan of the abdomen showed no acute source for your pain. I suspect you may need to follow up with her OB/GYN for chronic pelvic pain.    Abdominal Pain, Adult Many things can cause abdominal pain. Usually, abdominal pain is not caused by a disease and will improve without treatment. It can often be observed and treated at home. Your health care provider will do a physical exam and possibly order blood tests and X-rays to help determine the seriousness of your pain. However, in many cases, more time must pass before a clear cause of the pain can be found. Before that point, your health care provider may not know if you need more testing or further treatment. HOME CARE INSTRUCTIONS Monitor your abdominal pain for any changes. The following actions may help to alleviate any discomfort you are experiencing:  Only take over-the-counter or prescription medicines as directed by your health care provider.  Do not take laxatives unless directed to do so by your health care provider.  Try a clear liquid diet (broth, tea, or water) as directed by your health care provider. Slowly move to a bland diet as tolerated. SEEK MEDICAL CARE IF:  You have unexplained abdominal pain.  You have abdominal pain associated with nausea or diarrhea.  You have pain when you urinate or have a bowel movement.  You experience abdominal pain that wakes you in the night.  You have abdominal pain that is worsened or improved by eating food.  You have abdominal pain that is worsened with eating fatty foods.  You have a fever. SEEK IMMEDIATE MEDICAL CARE IF:  Your pain does not go away within 2  hours.  You keep throwing up (vomiting).  Your pain is felt only in portions of the abdomen, such as the right side or the left lower portion of the abdomen.  You pass bloody or black tarry stools. MAKE SURE YOU:  Understand these instructions.  Will watch your condition.  Will get help right away if you are not doing well or get worse.   This information is not intended to replace advice given to you by your health care provider. Make sure you discuss any questions you have with your health care provider.   Document Released: 09/30/2004 Document Revised: 09/11/2014 Document Reviewed: 08/30/2012 Elsevier Interactive Patient Education [employer].

## 2015-07-01 NOTE — ED Provider Notes (Signed)
[facility] Emergency Department Provider Note   ____________________________________________  Time seen: Approximately 2:25 PM I have reviewed the triage vital signs and the triage nursing note.  HISTORY  Chief Complaint Abdominal Pain   Historian Patient  HPI Carol Gomez is a 45 y.o. female with a history of what sounds like some chronic pelvic pain, previously diagnosed fibroids and ovarian cysts, who several weeks ago started having lower abdominal/pelvic discomfort more right sided today the past at least 24 hours. She states in the past she has used an ice pack to numb the pain, but it just doesn't seem like it is helping now. She is reporting her pain as 10 out of 10. No nausea or vomiting. No upper abdominal pain. patient diarrhea. No fevers.  Hx of endometrial ablation, no vaginal bleeding or discharge.  No prior appendectomy.    Past Medical History  Diagnosis Date  . Hypertension   . Diabetes mellitus without complication ([place])   . Sleep apnea   . GERD (gastroesophageal reflux disease)   . 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 Migraine   . Mechanical breakdown of electronic bone stimulator ([place])   . Hiatal hernia     There are no active problems to display for this patient.   No past surgical history on file.  Current Outpatient Rx  Name  Route  Sig  Dispense  Refill  . amLODipine (NORVASC) 10 MG tablet   Oral   Take 10 mg by mouth daily. for high blood pressure      0   . baclofen (LIORESAL) 10 MG tablet   Oral   Take 1 tablet by mouth 3 (three) times daily.      1   . BELSOMRA 20 MG TABS   Oral   Take 1 tablet by mouth at bedtime.      0     Dispense as written.   . clonazePAM (KLONOPIN) 0.5 MG tablet   Oral   Take 1 tablet by mouth daily.      0   . DULoxetine (CYMBALTA) 60 MG capsule   Oral   Take 1 capsule by mouth 2 (two) times daily.      0   . HORIZANT 600 MG TBCR   Oral   Take 1 tablet by mouth daily.       1     Dispense as written.   . hydrochlorothiazide (HYDRODIURIL) 25 MG tablet   Oral   Take 1 tablet by mouth daily.      0   . ibuprofen (ADVIL,MOTRIN) 600 MG tablet   Oral   Take 1 tablet (600 mg total) by mouth every 8 (eight) hours as needed.   20 tablet   0   . levothyroxine (SYNTHROID, LEVOTHROID) 125 MCG tablet   Oral   Take 1 tablet by mouth daily.      0   . meclizine (ANTIVERT) 25 MG tablet   Oral   Take 1 tablet (25 mg total) by mouth 3 (three) times daily as needed for dizziness.   20 tablet   0   . modafinil (PROVIGIL) 200 MG tablet   Oral   Take 1 tablet by mouth 2 (two) times daily.      0   . oxyCODONE (OXY IR/ROXICODONE) 5 MG immediate release tablet   Oral   Take 1 tablet by mouth every 4 (four) hours as needed.      0   . rOPINIRole (REQUIP)  0.25 MG tablet   Oral   Take 2 tablets by mouth daily.      0   . tiZANidine (ZANAFLEX) 4 MG tablet   Oral   Take 1 tablet by mouth 2 (two) times daily.      0   . Vitamin D, Ergocalciferol, (DRISDOL) 50000 UNITS CAPS capsule   Oral   Take 2 capsules by mouth once a week.      0     Allergies Dihydroergotamine  No family history on file.  Social History Social History  Substance Use Topics  . Smoking status: Never Smoker   . Smokeless tobacco: Former Systems developer  . Alcohol Use: No    Review of Systems  Constitutional: Negative for fever. Eyes: Negative for visual changes. ENT: Negative for sore throat. Cardiovascular: Negative for chest pain. Respiratory: Negative for shortness of breath. Gastrointestinal: Negative for vomiting and diarrhea. Genitourinary: Negative for dysuria. Musculoskeletal: Negative for back pain. Skin: Negative for rash. Neurological: Negative for headache. 10 point Review of Systems otherwise negative ____________________________________________   PHYSICAL EXAM:  VITAL SIGNS: ED Triage Vitals  Enc Vitals Group     BP 07/01/15 1408 181/98 mmHg      Pulse Rate 07/01/15 1408 82     Resp 07/01/15 1408 16     Temp 07/01/15 1408 97.8 F (36.6 C)     Temp Source 07/01/15 1408 Oral     SpO2 07/01/15 1408 100 %     Weight 07/01/15 1408 167 lb (75.751 kg)     Height 07/01/15 1408 5\' 1"  (1.549 m)     Head Cir --      Peak Flow --      Pain Score 07/01/15 1408 10     Pain Loc --      Pain Edu? --      Excl. in [place]? --      Constitutional: Alert and oriented. Well appearing and in no distress. HEENT   Head: Normocephalic and atraumatic.      Eyes: Conjunctivae are normal. PERRL. Normal extraocular movements.      Ears:         Nose: No congestion/rhinnorhea.   Mouth/Throat: Mucous membranes are moist.   Neck: No stridor. Cardiovascular/Chest: Normal rate, regular rhythm.  No murmurs, rubs, or gallops. Respiratory: Normal respiratory effort without tachypnea nor retractions. Breath sounds are clear and equal bilaterally. No wheezes/rales/rhonchi. Gastrointestinal: Soft. No distention, no guarding, no rebound. Moderate lower abdominal pain, especially rlq and suprapubic. Genitourinary/rectal:Deferred Musculoskeletal: Nontender with normal range of motion in all extremities. No joint effusions.  No lower extremity tenderness.  No edema. Neurologic:  Normal speech and language. No gross or focal neurologic deficits are appreciated. Skin:  Skin is warm, dry and intact. No rash noted. Psychiatric: Mood and affect are normal. Speech and behavior are normal. Patient exhibits appropriate insight and judgment.  ____________________________________________   EKG I, Lisa Roca, MD, the attending physician have personally viewed and interpreted all ECGs.  None ____________________________________________  LABS (pertinent positives/negatives)  Labs Reviewed  COMPREHENSIVE METABOLIC PANEL - Abnormal; Notable for the following:    Glucose, Bld 105 (*)    All other components within normal limits  URINALYSIS COMPLETEWITH  MICROSCOPIC ([facility] ONLY) - Abnormal; Notable for the following:    Color, Urine YELLOW (*)    APPearance CLEAR (*)    Squamous Epithelial / LPF 0-5 (*)    All other components within normal limits  CBC WITH DIFFERENTIAL/PLATELET  CBC WITH DIFFERENTIAL/PLATELET  ____________________________________________  RADIOLOGY All Xrays were viewed by me. Imaging interpreted by Radiologist.  Ct abd/pel:   IMPRESSION: Stable exam. No evidence of appendicitis or other acute findings within the abdomen or pelvis. __________________________________________  PROCEDURES  Procedure(s) performed: None  Critical Care performed: None  ____________________________________________   ED COURSE / ASSESSMENT AND PLAN  Pertinent labs & imaging results that were available during my care of the patient were reviewed by me and considered in my medical decision making (see chart for details).   Pt with history of prior pelvic pain who recently had an ultrasound ordered by her primary care physician that reportedly showed ovarian cysts is here for chronic persistent and worsening lower abdominal pain although more so in the right lower quadrant.  By her report and my review of the computerized medical record, does not appear that she's had a CT of her abdomen and pelvis. Her laboratory studies are reassuring, but without prior imaging I discussed with her obtaining to rule out emergency source of pain.  I reviewed her recent ultrasound which showed uterine fibroid which could potentially be causing some of her pain, but the ovarian cyst was on the left side and her pain today is more so right sided.  I did go ahead and discharge him tonight. She's had some relief with Toradol here and Norco tablet. I discussed with her and not prescribing narcotic medication for what seems like some chronic pain at this point without a certain diagnosis certainly no medical condition found on her evaluation today.  I  have referred her for OB/GYN follow-up and primary care follow-up.    CONSULTATIONS:   None   Patient / Family / Caregiver informed of clinical course, medical decision-making process, and agree with plan.   I discussed return precautions, follow-up instructions, and discharged instructions with patient and/or family.   ___________________________________________   FINAL CLINICAL IMPRESSION(S) / ED DIAGNOSES   Final diagnoses:  Lower abdominal pain              Note: This dictation was prepared with Dragon dictation. Any transcriptional errors that result from this process are unintentional   Lisa Roca, MD 07/01/15 1949

## 2015-07-21 ENCOUNTER — Other Ambulatory Visit: Payer: Self-pay | Admitting: Orthopedic Surgery

## 2015-07-21 DIAGNOSIS — M25561 Pain in right knee: Secondary | ICD-10-CM

## 2015-08-01 ENCOUNTER — Ambulatory Visit
Admission: RE | Admit: 2015-08-01 | Discharge: 2015-08-01 | Disposition: A | Discharge status: Home / Self Care | Payer: Medicare Other | Source: Ambulatory Visit | Attending: Orthopedic Surgery | Admitting: Orthopedic Surgery

## 2015-08-01 DIAGNOSIS — M25561 Pain in right knee: Secondary | ICD-10-CM | POA: Diagnosis present

## 2015-08-01 MED ORDER — IOHEXOL 180 MG/ML  SOLN
13.0000 mL | Freq: Once | INTRAMUSCULAR | Status: AC | PRN
  Administered 2015-08-01: 13 mL via INTRA_ARTICULAR

## 2015-08-01 MED ORDER — SODIUM CHLORIDE 0.9 % IJ SOLN
20.0000 mL | INTRAMUSCULAR | Status: DC | PRN
  Administered 2015-08-01: 20 mL
  Filled 2015-08-01: qty 20

## 2015-08-07 ENCOUNTER — Other Ambulatory Visit: Payer: Self-pay | Admitting: Orthopedic Surgery

## 2015-09-10 ENCOUNTER — Ambulatory Visit: Admission: RE | Admit: 2015-09-10 | Payer: Medicare Other | Source: Ambulatory Visit | Admitting: Orthopedic Surgery

## 2015-09-10 ENCOUNTER — Encounter: Admission: RE | Payer: Self-pay | Source: Ambulatory Visit

## 2015-09-10 SURGERY — ARTHROSCOPY, KNEE
Anesthesia: Choice | Laterality: Right

## 2016-03-08 ENCOUNTER — Ambulatory Visit: Payer: Medicare Other | Attending: Obstetrics & Gynecology | Admitting: Physical Therapy

## 2016-03-10 ENCOUNTER — Other Ambulatory Visit: Payer: Self-pay | Admitting: Orthopedic Surgery

## 2016-03-17 ENCOUNTER — Encounter: Payer: Medicare Other | Admitting: Physical Therapy

## 2016-03-22 ENCOUNTER — Encounter: Payer: Medicare Other | Admitting: Physical Therapy

## 2016-03-29 ENCOUNTER — Encounter: Payer: Self-pay | Admitting: *Deleted

## 2016-03-29 ENCOUNTER — Encounter
Admission: RE | Admit: 2016-03-29 | Discharge: 2016-03-29 | Disposition: A | Discharge status: Home / Self Care | Payer: Medicare Other | Source: Ambulatory Visit | Attending: Orthopedic Surgery | Admitting: Orthopedic Surgery

## 2016-03-29 DIAGNOSIS — M2391 Unspecified internal derangement of right knee: Secondary | ICD-10-CM | POA: Insufficient documentation

## 2016-03-29 DIAGNOSIS — Z01812 Encounter for preprocedural laboratory examination: Secondary | ICD-10-CM | POA: Diagnosis not present

## 2016-03-29 DIAGNOSIS — I10 Essential (primary) hypertension: Secondary | ICD-10-CM | POA: Insufficient documentation

## 2016-03-29 DIAGNOSIS — Z01818 Encounter for other preprocedural examination: Secondary | ICD-10-CM | POA: Insufficient documentation

## 2016-03-29 HISTORY — DX: Restless legs syndrome: G25.81

## 2016-03-29 HISTORY — DX: Depression, unspecified: F32.A

## 2016-03-29 HISTORY — DX: Major depressive disorder, single episode, unspecified: F32.9

## 2016-03-29 HISTORY — DX: Anxiety disorder, unspecified: F41.9

## 2016-03-29 HISTORY — DX: Unspecified osteoarthritis, unspecified site: M19.90

## 2016-03-29 HISTORY — DX: Post-traumatic stress disorder, unspecified: F43.10

## 2016-03-29 HISTORY — DX: Anemia, unspecified: D64.9

## 2016-03-29 HISTORY — DX: Tachycardia, unspecified: R00.0

## 2016-03-29 HISTORY — DX: Personal history of other diseases of the digestive system: Z87.19

## 2016-03-29 LAB — CBC WITH DIFFERENTIAL/PLATELET
BASOS PCT: 0 %
Basophils Absolute: 0 10*3/uL (ref 0–0.1)
EOS ABS: 0.1 10*3/uL (ref 0–0.7)
Eosinophils Relative: 1 %
HCT: 38.7 % (ref 35.0–47.0)
HEMOGLOBIN: 13.2 g/dL (ref 12.0–16.0)
LYMPHS PCT: 47 %
Lymphs Abs: 2.5 10*3/uL (ref 1.0–3.6)
MCH: 29.7 pg (ref 26.0–34.0)
MCHC: 34.1 g/dL (ref 32.0–36.0)
MCV: 87.1 fL (ref 80.0–100.0)
MONO ABS: 0.5 10*3/uL (ref 0.2–0.9)
MONOS PCT: 8 %
NEUTROS ABS: 2.4 10*3/uL (ref 1.4–6.5)
Neutrophils Relative %: 44 %
PLATELETS: 222 10*3/uL (ref 150–440)
RBC: 4.44 MIL/uL (ref 3.80–5.20)
RDW: 13.3 % (ref 11.5–14.5)
WBC: 5.5 10*3/uL (ref 3.6–11.0)

## 2016-03-29 LAB — APTT: aPTT: 34 seconds (ref 24–36)

## 2016-03-29 LAB — BASIC METABOLIC PANEL
ANION GAP: 6 (ref 5–15)
BUN: 12 mg/dL (ref 6–20)
CALCIUM: 8.9 mg/dL (ref 8.9–10.3)
CO2: 28 mmol/L (ref 22–32)
Chloride: 106 mmol/L (ref 101–111)
Creatinine, Ser: 0.78 mg/dL (ref 0.44–1.00)
GFR calc Af Amer: >60 mL/min (ref >60)
GFR calc non Af Amer: >60 mL/min (ref >60)
GLUCOSE: 92 mg/dL (ref 65–99)
POTASSIUM: 3.7 mmol/L (ref 3.5–5.1)
Sodium: 140 mmol/L (ref 135–145)

## 2016-03-29 LAB — PROTIME-INR
INR: 1.04
PROTHROMBIN TIME: 13.6 s (ref 11.4–15.2)

## 2016-03-29 NOTE — Patient Instructions (Signed)
Your procedure is scheduled on: April 07, 2016 (Wednesday) Report to Same Day Surgery 2nd floor medical mall [facility] Entrance-take elevator on left to 2nd floor.  Check in with surgery information desk.) To find out your arrival time please call [phone] between 1PM - 3PM on April 06, 2016 (Tuesday)  Remember: Instructions that are not followed completely may result in serious medical risk, up to and including death, or upon the discretion of your surgeon and anesthesiologist your surgery may need to be rescheduled.    _x___ 1. Do not eat food or drink liquids after midnight. No gum chewing or hard candies.                                  __x__ 2. No Alcohol for 24 hours before or after surgery.   __x__3. No Smoking for 24 prior to surgery.   ____  4. Bring all medications with you on the day of surgery if instructed.    __x__ 5. Notify your doctor if there is any change in your medical condition     (cold, fever, infections).     Do not wear jewelry, make-up, hairpins, clips or nail polish.  Do not wear lotions, powders, or perfumes. You may wear deodorant.  Do not shave 48 hours prior to surgery. Men may shave face and neck.  Do not bring valuables to the hospital.    [facility] is not responsible for any belongings or valuables.               Contacts, dentures or bridgework may not be worn into surgery.  Leave your suitcase in the car. After surgery it may be brought to your room.  For patients admitted to the hospital, discharge time is determined by your treatment team.                      Patients discharged the day of surgery will not be allowed to drive home.  You will need someone to drive you home and stay with you the night of your procedure.    Please read over the following fact sheets that you were given:   [facility] Preparing for Surgery and or MRSA Information   _x___ Take anti-hypertensive (unless it includes a diuretic), cardiac, seizure, asthma,      anti-reflux and psychiatric medicines with a sip of water.. These include:  1. AMLODIPINE  2. CYMBALTA .        ____Fleets enema or Magnesium Citrate as directed.   _x___ Use CHG Soap or sage wipes as directed on instruction sheet   ____ Use inhalers on the day of surgery and bring to hospital day of surgery  ____ Stop Metformin and Janumet 2 days prior to surgery.    ____ Take 1/2 of usual insulin dose the night before surgery and none on the morning surgery        _x___ Follow recommendations from Cardiologist, Pulmonologist or PCP regarding          stopping Aspirin, Coumadin, Pllavix ,Eliquis, Effient, or Pradaxa, and Pletal.  X____Stop Anti-inflammatories such as Advil, Aleve, Ibuprofen, Motrin, Naproxen, Naprosyn, Goodies powders or aspirin products. OK to take Tylenol    _x___ Stop supplements until after surgery.  But may continue Vitamin D, Vitamin B, and multivitamin.         _x___ Bring C-Pap to the hospital.   [facility]  INTERSTIM REMOTE TO HOSPITAL THE DAY OF SURGERY !!

## 2016-03-31 ENCOUNTER — Encounter: Payer: Medicare Other | Admitting: Physical Therapy

## 2016-04-05 ENCOUNTER — Encounter: Payer: Medicare Other | Admitting: Physical Therapy

## 2016-04-07 ENCOUNTER — Ambulatory Visit: Admission: RE | Admit: 2016-04-07 | Payer: Medicare Other | Source: Ambulatory Visit | Admitting: Orthopedic Surgery

## 2016-04-07 ENCOUNTER — Encounter: Admission: RE | Payer: Self-pay | Source: Ambulatory Visit

## 2016-04-07 SURGERY — ARTHROSCOPY, KNEE
Anesthesia: Choice | Laterality: Right

## 2016-04-19 ENCOUNTER — Encounter: Payer: Medicare Other | Admitting: Physical Therapy

## 2016-04-19 NOTE — Pre-Procedure Instructions (Signed)
CLEARED BY DR Otilio Carpen  04/13/16

## 2016-04-20 ENCOUNTER — Other Ambulatory Visit: Payer: Self-pay | Admitting: Orthopedic Surgery

## 2016-04-27 MED ORDER — CEFAZOLIN SODIUM-DEXTROSE 2-4 GM/100ML-% IV SOLN
2.0000 g | INTRAVENOUS | Status: AC
  Administered 2016-04-28: 2 g via INTRAVENOUS

## 2016-04-27 MED ORDER — FAMOTIDINE 20 MG PO TABS
20.0000 mg | ORAL_TABLET | Freq: Once | ORAL | Status: AC
  Administered 2016-04-28: 20 mg via ORAL

## 2016-04-28 ENCOUNTER — Ambulatory Visit: Payer: Medicare Other | Admitting: Certified Registered Nurse Anesthetist

## 2016-04-28 ENCOUNTER — Encounter
Admission: RE | Disposition: A | Discharge status: Home / Self Care | Payer: Self-pay | Source: Ambulatory Visit | Attending: Orthopedic Surgery

## 2016-04-28 ENCOUNTER — Ambulatory Visit
Admission: RE | Admit: 2016-04-28 | Discharge: 2016-04-28 | Disposition: A | Discharge status: Home / Self Care | Payer: Medicare Other | Source: Ambulatory Visit | Attending: Orthopedic Surgery | Admitting: Orthopedic Surgery

## 2016-04-28 DIAGNOSIS — M1990 Unspecified osteoarthritis, unspecified site: Secondary | ICD-10-CM | POA: Diagnosis not present

## 2016-04-28 DIAGNOSIS — G4733 Obstructive sleep apnea (adult) (pediatric): Secondary | ICD-10-CM | POA: Diagnosis not present

## 2016-04-28 DIAGNOSIS — E039 Hypothyroidism, unspecified: Secondary | ICD-10-CM | POA: Diagnosis not present

## 2016-04-28 DIAGNOSIS — F419 Anxiety disorder, unspecified: Secondary | ICD-10-CM | POA: Insufficient documentation

## 2016-04-28 DIAGNOSIS — G2581 Restless legs syndrome: Secondary | ICD-10-CM | POA: Insufficient documentation

## 2016-04-28 DIAGNOSIS — K219 Gastro-esophageal reflux disease without esophagitis: Secondary | ICD-10-CM | POA: Insufficient documentation

## 2016-04-28 DIAGNOSIS — I10 Essential (primary) hypertension: Secondary | ICD-10-CM | POA: Insufficient documentation

## 2016-04-28 DIAGNOSIS — F4310 Post-traumatic stress disorder, unspecified: Secondary | ICD-10-CM | POA: Diagnosis not present

## 2016-04-28 DIAGNOSIS — R7303 Prediabetes: Secondary | ICD-10-CM | POA: Insufficient documentation

## 2016-04-28 DIAGNOSIS — M2391 Unspecified internal derangement of right knee: Secondary | ICD-10-CM | POA: Diagnosis present

## 2016-04-28 HISTORY — PX: KNEE ARTHROSCOPY: SHX127

## 2016-04-28 HISTORY — DX: Hypothyroidism, unspecified: E03.9

## 2016-04-28 LAB — GLUCOSE, CAPILLARY
GLUCOSE-CAPILLARY: 80 mg/dL (ref 65–99)
Glucose-Capillary: 95 mg/dL (ref 65–99)

## 2016-04-28 SURGERY — ARTHROSCOPY, KNEE
Anesthesia: General | Site: Knee | Laterality: Right | Wound class: Clean

## 2016-04-28 MED ORDER — LIDOCAINE HCL (PF) 2 % IJ SOLN
INTRAMUSCULAR | Status: AC
  Filled 2016-04-28: qty 2

## 2016-04-28 MED ORDER — FENTANYL CITRATE (PF) 100 MCG/2ML IJ SOLN
INTRAMUSCULAR | Status: AC
  Filled 2016-04-28: qty 2

## 2016-04-28 MED ORDER — LACTATED RINGERS IV SOLN
INTRAVENOUS | Status: DC | PRN
  Administered 2016-04-28: 14:00:00 via INTRAVENOUS

## 2016-04-28 MED ORDER — MIDAZOLAM HCL 2 MG/2ML IJ SOLN
INTRAMUSCULAR | Status: AC
  Filled 2016-04-28: qty 2

## 2016-04-28 MED ORDER — LIDOCAINE HCL (PF) 1 % IJ SOLN
INTRAMUSCULAR | Status: AC
  Filled 2016-04-28: qty 30

## 2016-04-28 MED ORDER — CHLORHEXIDINE GLUCONATE CLOTH 2 % EX PADS: 6.0000 | MEDICATED_PAD | Freq: Once | CUTANEOUS | Status: DC

## 2016-04-28 MED ORDER — FENTANYL CITRATE (PF) 100 MCG/2ML IJ SOLN
INTRAMUSCULAR | Status: DC | PRN
  Administered 2016-04-28 (×2): 25 ug via INTRAVENOUS
  Administered 2016-04-28: 50 ug via INTRAVENOUS

## 2016-04-28 MED ORDER — SODIUM CHLORIDE 0.9 % IV SOLN
INTRAVENOUS | Status: DC
  Administered 2016-04-28: 12:00:00 via INTRAVENOUS

## 2016-04-28 MED ORDER — GLYCOPYRROLATE 0.2 MG/ML IJ SOLN
INTRAMUSCULAR | Status: AC
  Filled 2016-04-28: qty 1

## 2016-04-28 MED ORDER — BUPIVACAINE-EPINEPHRINE 0.25% -1:200000 IJ SOLN
INTRAMUSCULAR | Status: DC | PRN
  Administered 2016-04-28: 25 mL

## 2016-04-28 MED ORDER — PROPOFOL 10 MG/ML IV BOLUS
INTRAVENOUS | Status: AC
  Filled 2016-04-28: qty 20

## 2016-04-28 MED ORDER — FENTANYL CITRATE (PF) 100 MCG/2ML IJ SOLN
25.0000 ug | INTRAMUSCULAR | Status: DC | PRN
  Administered 2016-04-28 (×2): 50 ug via INTRAVENOUS

## 2016-04-28 MED ORDER — PROPOFOL 10 MG/ML IV BOLUS
INTRAVENOUS | Status: DC | PRN
Start: 2016-04-28 — End: 2016-04-28
  Administered 2016-04-28: 160 mg via INTRAVENOUS

## 2016-04-28 MED ORDER — EPHEDRINE SULFATE 50 MG/ML IJ SOLN
INTRAMUSCULAR | Status: DC | PRN
  Administered 2016-04-28 (×2): 5 mg via INTRAVENOUS

## 2016-04-28 MED ORDER — BUPIVACAINE-EPINEPHRINE (PF) 0.25% -1:200000 IJ SOLN
INTRAMUSCULAR | Status: AC
  Filled 2016-04-28: qty 30

## 2016-04-28 MED ORDER — MIDAZOLAM HCL 2 MG/2ML IJ SOLN
INTRAMUSCULAR | Status: DC | PRN
  Administered 2016-04-28: 2 mg via INTRAVENOUS

## 2016-04-28 MED ORDER — CEFAZOLIN SODIUM-DEXTROSE 2-4 GM/100ML-% IV SOLN
INTRAVENOUS | Status: AC
  Filled 2016-04-28: qty 100

## 2016-04-28 MED ORDER — HYDROCODONE-ACETAMINOPHEN 5-325 MG PO TABS: 1.0000 | ORAL_TABLET | Freq: Four times a day (QID) | ORAL | 0 refills | Status: DC | PRN

## 2016-04-28 MED ORDER — FENTANYL CITRATE (PF) 100 MCG/2ML IJ SOLN
INTRAMUSCULAR | Status: AC
  Administered 2016-04-28: 50 ug via INTRAVENOUS
  Filled 2016-04-28: qty 2

## 2016-04-28 MED ORDER — FAMOTIDINE 20 MG PO TABS
ORAL_TABLET | ORAL | Status: AC
  Administered 2016-04-28: 20 mg via ORAL
  Filled 2016-04-28: qty 1

## 2016-04-28 MED ORDER — PHENYLEPHRINE HCL 10 MG/ML IJ SOLN
INTRAMUSCULAR | Status: DC | PRN
  Administered 2016-04-28 (×2): 100 ug via INTRAVENOUS

## 2016-04-28 MED ORDER — ONDANSETRON HCL 4 MG PO TABS: 4.0000 mg | ORAL_TABLET | Freq: Three times a day (TID) | ORAL | 0 refills | Status: DC | PRN

## 2016-04-28 MED ORDER — PROMETHAZINE HCL 25 MG/ML IJ SOLN: 6.2500 mg | INTRAMUSCULAR | Status: DC | PRN

## 2016-04-28 MED ORDER — LIDOCAINE HCL 1 % IJ SOLN
INTRAMUSCULAR | Status: DC | PRN
  Administered 2016-04-28: 10 mL

## 2016-04-28 MED ORDER — DEXAMETHASONE SODIUM PHOSPHATE 10 MG/ML IJ SOLN
INTRAMUSCULAR | Status: AC
  Filled 2016-04-28: qty 1

## 2016-04-28 MED ORDER — ASPIRIN EC 325 MG PO TBEC: 325.0000 mg | DELAYED_RELEASE_TABLET | Freq: Every day | ORAL | 0 refills | Status: DC

## 2016-04-28 MED ORDER — LIDOCAINE HCL (CARDIAC) 20 MG/ML IV SOLN
INTRAVENOUS | Status: DC | PRN
  Administered 2016-04-28: 40 mg via INTRAVENOUS

## 2016-04-28 MED ORDER — ONDANSETRON HCL 4 MG/2ML IJ SOLN
INTRAMUSCULAR | Status: DC | PRN
  Administered 2016-04-28: 4 mg via INTRAVENOUS

## 2016-04-28 SURGICAL SUPPLY — 35 items
BUR RADIUS 3.5 (BURR) ×3 IMPLANT
BUR RADIUS 4.0X18.5 (BURR) ×3 IMPLANT
CLOSURE WOUND 1/2 X4 (GAUZE/BANDAGES/DRESSINGS) ×2
COOLER POLAR GLACIER W/PUMP (MISCELLANEOUS) IMPLANT
CUFF TOURN 24 STER (MISCELLANEOUS) ×3 IMPLANT
CUFF TOURN 30 STER DUAL PORT (MISCELLANEOUS) IMPLANT
DRAPE IMP U-DRAPE 54X76 (DRAPES) ×3 IMPLANT
DURAPREP 26ML APPLICATOR (WOUND CARE) ×9 IMPLANT
GAUZE PETRO XEROFOAM 1X8 (MISCELLANEOUS) ×3 IMPLANT
GAUZE SPONGE 4X4 12PLY STRL (GAUZE/BANDAGES/DRESSINGS) ×3 IMPLANT
GLOVE BIOGEL PI IND STRL 9 (GLOVE) ×1 IMPLANT
GLOVE BIOGEL PI INDICATOR 9 (GLOVE) ×2
GLOVE SURG 9.0 ORTHO LTXF (GLOVE) ×6 IMPLANT
GOWN STRL REUS TWL 2XL XL LVL4 (GOWN DISPOSABLE) ×3 IMPLANT
GOWN STRL REUS W/ TWL LRG LVL3 (GOWN DISPOSABLE) ×1 IMPLANT
GOWN STRL REUS W/TWL LRG LVL3 (GOWN DISPOSABLE) ×2
IV LACTATED RINGER IRRG 3000ML (IV SOLUTION) ×8
IV LR IRRIG 3000ML ARTHROMATIC (IV SOLUTION) ×4 IMPLANT
KIT RM TURNOVER STRD PROC AR (KITS) ×3 IMPLANT
MANIFOLD NEPTUNE II (INSTRUMENTS) ×3 IMPLANT
PACK ARTHROSCOPY KNEE (MISCELLANEOUS) ×3 IMPLANT
PAD ABD DERMACEA PRESS 5X9 (GAUZE/BANDAGES/DRESSINGS) ×6 IMPLANT
PAD WRAPON POLAR KNEE (MISCELLANEOUS) ×1 IMPLANT
SET TUBE SUCT SHAVER OUTFL 24K (TUBING) ×3 IMPLANT
SET TUBE TIP INTRA-ARTICULAR (MISCELLANEOUS) ×3 IMPLANT
SOL PREP PVP 2OZ (MISCELLANEOUS)
SOLUTION PREP PVP 2OZ (MISCELLANEOUS) IMPLANT
STRIP CLOSURE SKIN 1/2X4 (GAUZE/BANDAGES/DRESSINGS) ×4 IMPLANT
SUT ETHILON 4-0 (SUTURE) ×2
SUT ETHILON 4-0 FS2 18XMFL BLK (SUTURE) ×1
SUTURE ETHLN 4-0 FS2 18XMF BLK (SUTURE) ×1 IMPLANT
TUBING ARTHRO INFLOW-ONLY STRL (TUBING) ×3 IMPLANT
WAND HAND CNTRL MULTIVAC 50 (MISCELLANEOUS) IMPLANT
WAND HAND CNTRL MULTIVAC 90 (MISCELLANEOUS) ×3 IMPLANT
WRAPON POLAR PAD KNEE (MISCELLANEOUS) ×3

## 2016-04-28 NOTE — OR Nursing (Signed)
Dr Raphael Gibney aware patient did not have beta blocker this am. No new orders.

## 2016-04-28 NOTE — Discharge Instructions (Signed)
  AMBULATORY SURGERY  DISCHARGE INSTRUCTIONS   1) The drugs that you were given will stay in your system until tomorrow so for the next 24 hours you should not:  A) Drive an automobile B) Make any legal decisions C) Drink any alcoholic beverage   2) You may resume regular meals tomorrow.  Today it is better to start with liquids and gradually work up to solid foods.  You may eat anything you prefer, but it is better to start with liquids, then soup and crackers, and gradually work up to solid foods.   3) Please notify your doctor immediately if you have any unusual bleeding, trouble breathing, redness and pain at the surgery site, drainage, fever, or pain not relieved by medication.    4) Additional Instructions: TAKE A STOOL SOFTENER TWICE A DAY WHILE TAKING NARCOTIC PAIN MEDICINE TO PREVENT CONSTIPATION   Please contact your physician with any problems or Same Day Surgery at [phone], Monday through Friday 6 am to 4 pm, or [place] at [place] Main number at [phone].   

## 2016-04-28 NOTE — OR Nursing (Signed)
[employer]tronic Intersim located in  right hip cut off per patient prior to going to surgery.

## 2016-04-28 NOTE — Transfer of Care (Signed)
Immediate Anesthesia Transfer of Care Note  Patient: Carol Gomez  Procedure(s) Performed: Procedure(s) with comments: ARTHROSCOPY KNEE (Right) - Extensive synovectomy and lysis of adhesions  Patient Location: PACU  Anesthesia Type:General  Level of Consciousness: awake  Airway & Oxygen Therapy: Patient Spontanous Breathing and Patient connected to face mask oxygen  Post-op Assessment: Report given to RN and Post -op Vital signs reviewed and stable  Post vital signs: Reviewed and stable  Last Vitals:  Vitals:   04/28/16 1151  BP: (!) 173/100  Pulse: 83  Resp: 16  Temp: 36.3 C    Last Pain:  Vitals:   04/28/16 1151  TempSrc: Tympanic         Complications: No apparent anesthesia complications

## 2016-04-28 NOTE — H&P (Signed)
The patient has been re-examined, and the chart reviewed, and there have been no interval changes to the documented history and physical.    The risks, benefits, and alternatives have been discussed at length, and the patient is willing to proceed.   

## 2016-04-28 NOTE — OR Nursing (Signed)
Clarified consent with Dr Mack Guise.  Consent for Right knee diagnostic arthroscopy.

## 2016-04-28 NOTE — Op Note (Signed)
PATIENT:  Carol Gomez  PRE-OPERATIVE DIAGNOSIS:  Persistent anterior knee pain  POST-OPERATIVE DIAGNOSIS:  Same  PROCEDURE:  RIGHT KNEE ARTHROSCOPY WITH DEBRIDEMENT OF HOFFA'S FAT PAD, SYNOVECTOMY AND MEDIAL PLICA RESECTION WITH CHONDROPLASTY OF THE MEDIAL FEMORAL CONDYLE  SURGEON:  Thornton Park, MD  ANESTHESIA:   General  PREOPERATIVE INDICATIONS:  Carol Gomez  46 y.o. female with persistent right anterior knee pain who failed conservative management and elected to undergo a diagnostic arthroscopy.    The risks benefits and alternatives were discussed with the patient preoperatively including the risks of infection, bleeding, nerve injury, knee stiffness, persistent pain, osteoarthritis and the need for further surgery. Medical  risks include DVT and pulmonary embolism, myocardial infarction, stroke, pneumonia, respiratory failure and death. The patient understood these risks and wished to proceed.   OPERATIVE FINDINGS: Approximately of Hoffa's fat pad, extensive synovitis and thickened and inflamed medial plica  OPERATIVE PROCEDURE: Patient was met in the preoperative area. The operative extremity was signed with the word yes and my initials according the hospital's correct site of surgery protocol.  The patient was brought to the operating room where they was placed supine on the operative table. General anesthesia was administered. The patient was prepped and draped in a sterile fashion.  A timeout was performed to verify the patient's name, date of birth, medical record number, correct site of surgery correct procedure to be performed. It was also used to verify the patient received antibiotics that all appropriate instruments, and radiographic studies were available in the room. Once all in attendance were in agreement, the case began.  Proposed arthroscopy incisions were drawn out with a surgical marker. These were pre-injected with 1% lidocaine plain. An 11 blade  was used to establish an inferior lateral and inferomedial portals. The inferomedial portal was created using a 18-gauge spinal needle under direct visualization.  A full diagnostic examination of the knee was performed including the suprapatellar pouch, patellofemoral joint, medial lateral compartments as well as the medial lateral gutters, the intercondylar notch in the posterior knee.  Patient was found to have an enlarged thickened and inflamed medial plica. She had extensive synovitis in the anterior knee with hypertrophy of Hoffa's fat pad. She had a focal area of chondromalacia in the marginal aspect of the medial femoral condyle.  Patient had Hoffa's fat pad debrided with a 4 resector shaver blade and 90 ArthroCare wand. Patient underwent an extensive synovectomy of the anterior knee and resection of medial plica. This was also performed with the 4.0 mm resector shaver blade and 90 ArthroCare wand. A chondroplasty of the medial femoral condyle was performed with a 4.0 resector shaver blade. The knee was then copiously irrigated.  The anterior cruciate ligament was found to be intact. There is no medial or lateral meniscal tears. There is no chondral lesions in the lateral compartments and only mild chondral fraying in the patellofemoral joint.  Patient had the meniscal tear treated with a 4-0 resector shaver blade and straight duckbill basket. The meniscus was debrided until a stable rim was achieved. A chondroplasty of the medial femoral condyle, trochlea and undersurface of patella was also performed using a 4-0 resector shaver blade. A partial synovectomy was also performed using a 4-0 resector shaver blade and 90 ArthroCare wand.  The knee was then copiously lavaged. All arthroscopic instruments were removed. The 2 arthroscopy portals were closed with 4-0 nylon. Steri-Strips were applied along with a dry sterile and compressive dressing. The patient was brought to  the PACU in stable  condition. I was scrubbed and present for the entire case and all sharp and instrument counts were correct at the conclusion the case. I spoke with the patient's daughter postoperatively to let her know the case was performed without complication and the patient was stable in the recovery room.  Timoteo Gaul, MD

## 2016-04-28 NOTE — Anesthesia Post-op Follow-up Note (Cosign Needed)
Anesthesia QCDR form completed.        

## 2016-04-28 NOTE — Anesthesia Procedure Notes (Signed)
Procedure Name: LMA Insertion Date/Time: 04/28/2016 1:40 PM Performed by: Mystery Schrupp Pre-anesthesia Checklist: Patient identified, Emergency Drugs available, Suction available, Patient being monitored and Timeout performed Patient Re-evaluated:Patient Re-evaluated prior to inductionOxygen Delivery Method: Circle system utilized Preoxygenation: Pre-oxygenation with 100% oxygen Intubation Type: IV induction Ventilation: Mask ventilation without difficulty LMA: LMA inserted LMA Size: 4.0 Number of attempts: 1 Placement Confirmation: positive ETCO2 and breath sounds checked- equal and bilateral Tube secured with: Tape Dental Injury: Teeth and Oropharynx as per pre-operative assessment

## 2016-04-28 NOTE — Anesthesia Preprocedure Evaluation (Signed)
Anesthesia Evaluation  Patient identified by MRN, date of birth, ID band Patient awake    Reviewed: Allergy & Precautions, H&P , NPO status , Patient's Chart, lab work & pertinent test results, reviewed documented beta blocker date and time   Airway Mallampati: IV  TM Distance: >3 FB Neck ROM: full    Dental  (+) Poor Dentition, Missing, Dental Advidsory Given   Pulmonary neg shortness of breath, sleep apnea and Continuous Positive Airway Pressure Ventilation , neg COPD, neg recent URI,           Cardiovascular Exercise Tolerance: Good hypertension, (-) angina(-) CAD, (-) Past MI, (-) Cardiac Stents and (-) CABG + dysrhythmias (-) Valvular Problems/Murmurs     Neuro/Psych  Headaches, neg Seizures PSYCHIATRIC DISORDERS (Depression and PTSD) Pituitary adenoma, being watched    GI/Hepatic Neg liver ROS, hiatal hernia, GERD  ,  Endo/Other  diabetes (borderline), Oral Hypoglycemic AgentsHypothyroidism   Renal/GU negative Renal ROS Bladder dysfunction  Stimulator for bladder    Musculoskeletal   Abdominal   Peds  Hematology negative hematology ROS (+)   Anesthesia Other Findings Past Medical History: No date: Anemia No date: Anxiety No date: Arthritis No date: Depression No date: Diabetes mellitus without complication (HCC) No date: GERD (gastroesophageal reflux disease) No date: History of hiatal hernia No date: Hypertension No date: Hypothyroidism No date: Mechanical breakdown of electronic bone stimul* No date: Migraine No date: Pituitary tumor No date: PTSD (post-traumatic stress disorder) No date: Restless leg syndrome No date: Sleep apnea     Comment: OSA-USE C-PAP No date: Tachycardia   Reproductive/Obstetrics negative OB ROS                             Anesthesia Physical Anesthesia Plan  ASA: III  Anesthesia Plan: General   Post-op Pain Management:    Induction:    Airway Management Planned:   Additional Equipment:   Intra-op Plan:   Post-operative Plan:   Informed Consent: I have reviewed the patients History and Physical, chart, labs and discussed the procedure including the risks, benefits and alternatives for the proposed anesthesia with the patient or authorized representative who has indicated his/her understanding and acceptance.   Dental Advisory Given  Plan Discussed with: Anesthesiologist, CRNA and Surgeon  Anesthesia Plan Comments:         Anesthesia Quick Evaluation

## 2016-05-03 ENCOUNTER — Encounter: Payer: Medicare Other | Admitting: Physical Therapy

## 2016-05-06 NOTE — Anesthesia Postprocedure Evaluation (Signed)
Anesthesia Post Note  Patient: Carol Gomez  Procedure(s) Performed: Procedure(s) (LRB): ARTHROSCOPY KNEE (Right)  Patient location during evaluation: PACU Anesthesia Type: General Level of consciousness: awake Pain management: pain level controlled Vital Signs Assessment: post-procedure vital signs reviewed and stable Respiratory status: spontaneous breathing Cardiovascular status: stable Anesthetic complications: no     Last Vitals:  Vitals:   04/28/16 1602 04/28/16 1626  BP: (!) 154/77 137/80  Pulse: 64 66  Resp: 16 16  Temp: 37 C     Last Pain:  Vitals:   04/29/16 1704  TempSrc:   PainSc: 0-No pain                 VAN STAVEREN,Aimi Essner

## 2017-01-21 ENCOUNTER — Emergency Department
Admission: EM | Admit: 2017-01-21 | Discharge: 2017-01-21 | Disposition: A | Discharge status: Home / Self Care | Payer: Medicare Other | Attending: Emergency Medicine | Admitting: Emergency Medicine

## 2017-01-21 ENCOUNTER — Other Ambulatory Visit: Payer: Self-pay

## 2017-01-21 ENCOUNTER — Encounter: Payer: Self-pay | Admitting: Emergency Medicine

## 2017-01-21 DIAGNOSIS — Z7982 Long term (current) use of aspirin: Secondary | ICD-10-CM | POA: Diagnosis not present

## 2017-01-21 DIAGNOSIS — E119 Type 2 diabetes mellitus without complications: Secondary | ICD-10-CM | POA: Diagnosis not present

## 2017-01-21 DIAGNOSIS — B9789 Other viral agents as the cause of diseases classified elsewhere: Secondary | ICD-10-CM | POA: Diagnosis not present

## 2017-01-21 DIAGNOSIS — I10 Essential (primary) hypertension: Secondary | ICD-10-CM | POA: Diagnosis not present

## 2017-01-21 DIAGNOSIS — E039 Hypothyroidism, unspecified: Secondary | ICD-10-CM | POA: Diagnosis not present

## 2017-01-21 DIAGNOSIS — J029 Acute pharyngitis, unspecified: Secondary | ICD-10-CM | POA: Diagnosis present

## 2017-01-21 DIAGNOSIS — Z79899 Other long term (current) drug therapy: Secondary | ICD-10-CM | POA: Diagnosis not present

## 2017-01-21 DIAGNOSIS — J069 Acute upper respiratory infection, unspecified: Secondary | ICD-10-CM

## 2017-01-21 DIAGNOSIS — Z96651 Presence of right artificial knee joint: Secondary | ICD-10-CM | POA: Diagnosis not present

## 2017-01-21 LAB — GROUP A STREP BY PCR: GROUP A STREP BY PCR: NOT DETECTED

## 2017-01-21 MED ORDER — GUAIFENESIN-CODEINE 100-10 MG/5ML PO SOLN: 10.0000 mL | Freq: Three times a day (TID) | ORAL | 0 refills | Status: DC | PRN

## 2017-01-21 NOTE — ED Triage Notes (Signed)
Pt c/o sore throat, chills, generalized body aches, and cough x 3 days. Pt in NAD at this time. Denies fever but states that she has been hot and cold.

## 2017-01-21 NOTE — Discharge Instructions (Signed)
Follow up with your primary care provider for symptoms that are not improving over the week. Return to the ER for symptoms that change or worsen if you are unable to schedule an appointment.

## 2017-01-21 NOTE — ED Notes (Signed)
See triage note  States she developed some subjective fever and sore throat 3 days ago  Afebrile on arrival  Body aches

## 2017-01-21 NOTE — ED Provider Notes (Signed)
[facility] Emergency Department Provider Note  ____________________________________________  Time seen: Approximately 11:29 AM  I have reviewed the triage vital signs and the nursing notes.   HISTORY  Chief Complaint Sore Throat   HPI Carol Gomez is a 47 y.o. female who presents to the emergency department for evaluation treatment of subjective fever, sore throat, body aches, and cough.  She has taken over-the-counter medications without relief.  Past Medical History:  Diagnosis Date  . Anemia   . Anxiety   . Arthritis   . Depression   . Diabetes mellitus without complication ([place])   . GERD (gastroesophageal reflux disease)   . History of hiatal hernia   . Hypertension   . Hypothyroidism   . Mechanical breakdown of electronic bone stimulator ([place])   . Migraine   . Pituitary tumor   . PTSD (post-traumatic stress disorder)   . Restless leg syndrome   . Sleep apnea    OSA-USE C-PAP  . Tachycardia     There are no active problems to display for this patient.   Past Surgical History:  Procedure Laterality Date  . ABDOMINAL HYSTERECTOMY    . BREAST REDUCTION SURGERY Bilateral 2007  . CARPAL TUNNEL RELEASE Bilateral   . HEEL SPUR SURGERY Bilateral   . INTERSTIM IMPLANT PLACEMENT Right 01/28/2010   right hip ([employer])  . KNEE ARTHROSCOPY Right 04/28/2016   Procedure: ARTHROSCOPY KNEE;  Surgeon: Thornton Park, MD;  Location: [facility] ORS;  Service: Orthopedics;  Laterality: Right;  Extensive synovectomy and lysis of adhesions  . TRIGGER FINGER RELEASE Bilateral     Prior to Admission medications   Medication Sig Start Date End Date Taking? Authorizing Provider  amLODipine (NORVASC) 10 MG tablet Take 10 mg by mouth daily. for high blood pressure 08/09/14   [provider]  aspirin EC 325 MG tablet Take 1 tablet (325 mg total) by mouth daily. 04/28/16   Thornton Park, MD  baclofen (LIORESAL) 10 MG tablet Take 10 mg by mouth at  bedtime.  10/21/14   [provider]  BELSOMRA 20 MG TABS Take 20 mg by mouth at bedtime.  10/21/14   [provider]  busPIRone (BUSPAR) 10 MG tablet Take 10 mg by mouth at bedtime. 04/05/16   [provider]  DULoxetine (CYMBALTA) 60 MG capsule Take 60 mg by mouth at bedtime.  10/31/14   [provider]  gabapentin (NEURONTIN) 400 MG capsule Take 400 mg by mouth daily with supper. 04/13/16   [provider]  guaiFENesin-codeine 100-10 MG/5ML syrup Take 10 mLs by mouth 3 (three) times daily as needed. 01/21/17   Armida Vickroy B, FNP  hydrochlorothiazide (HYDRODIURIL) 25 MG tablet Take 25 mg by mouth daily.  08/09/14   [provider]  lactulose (CHRONULAC) 10 GM/15ML solution Take 5 g by mouth 2 (two) times daily. Takes 1/2 tablespoon.    [provider]  levothyroxine (SYNTHROID, LEVOTHROID) 100 MCG tablet Take 100 mcg by mouth daily before breakfast. 03/27/16   [provider]  meloxicam (MOBIC) 7.5 MG tablet Take 7.5 mg by mouth at bedtime. 04/12/16   [provider]  metoprolol tartrate (LOPRESSOR) 25 MG tablet Take 25 mg by mouth daily. 04/06/16   [provider]  modafinil (PROVIGIL) 200 MG tablet Take 200 mg by mouth 2 (two) times daily.  10/28/14   [provider]  ondansetron (ZOFRAN) 4 MG tablet Take 1 tablet (4 mg total) by mouth every 8 (eight) hours as needed for  nausea or vomiting. 04/28/16   Thornton Park, MD  rOPINIRole (REQUIP) 0.25 MG tablet Take 0.5 tablets by mouth at bedtime.  09/06/14   [provider]  tiZANidine (ZANAFLEX) 4 MG tablet Take 4 mg by mouth at bedtime as needed for muscle spasms.  09/20/14   [provider]  Vitamin D, Ergocalciferol, (DRISDOL) 50000 UNITS CAPS capsule Take 50,000 Units by mouth 2 (two) times a week. Monday and Thursday 10/26/14   [provider]    Allergies Dihydroergotamine  Family History  Problem Relation Age of Onset  .  Hypertension Mother     Social History Social History   Tobacco Use  . Smoking status: Never Smoker  . Smokeless tobacco: Former Network engineer Use Topics  . Alcohol use: No  . Drug use: No    Review of Systems Constitutional: Positive for fever/chills ENT: Positive for sore throat. Cardiovascular: Denies chest pain. Respiratory: Negative for shortness of breath.  Positive for cough. Gastrointestinal: Negative for nausea, no vomiting.  No diarrhea.  Musculoskeletal: Positive for body aches Skin: Negative for rash. Neurological: Negative for headaches ____________________________________________   PHYSICAL EXAM:  VITAL SIGNS: ED Triage Vitals [01/21/17 0832]  Enc Vitals Group     BP (!) 157/91     Pulse Rate 88     Resp 16     Temp 97.8 F (36.6 C)     Temp Source Oral     SpO2 100 %     Weight 167 lb (75.8 kg)     Height 5\' 1"  (1.549 m)     Head Circumference      Peak Flow      Pain Score 10     Pain Loc      Pain Edu?      Excl. in [place]?     Constitutional: Alert and oriented.  Acutely ill appearing and in no acute distress. Eyes: Conjunctivae are normal. EOMI. Ears: Bilateral tympanic membranes are normal.  Nose: Sinus congestion noted; clear rhinnorhea. Mouth/Throat: Mucous membranes are moist.  Oropharynx erythematous. Tonsils without exudate. Neck: No stridor.  Lymphatic: No cervical lymphadenopathy. Cardiovascular: Normal rate, regular rhythm. Good peripheral circulation. Respiratory: Normal respiratory effort.  No retractions.  Breath sounds clear to auscultation throughout.. Gastrointestinal: Soft and nontender.  Musculoskeletal: FROM x 4 extremities.  Neurologic:  Normal speech and language.  Skin:  Skin is warm, dry and intact. No rash noted. Psychiatric: Mood and affect are normal. Speech and behavior are normal.  ____________________________________________   LABS (all labs ordered are listed, but only abnormal results are  displayed)  Labs Reviewed  GROUP A STREP BY PCR   ____________________________________________  EKG  Not indicated ____________________________________________  RADIOLOGY  Not indicated ____________________________________________   PROCEDURES  Procedure(s) performed: No  Critical Care performed: No ____________________________________________   INITIAL IMPRESSION / ASSESSMENT AND PLAN / ED COURSE  47 year old female presenting to the emergency department for treatment and evaluation of symptoms most consistent with a viral upper respiratory infection.  She will be given a prescription for guaifenesin with codeine and encouraged to rest and increase fluid intake.  She was advised to follow-up with her primary care provider for symptoms that are not improving over the week.  She was encouraged to return to the emergency department for symptoms of change or worsen if she is unable to schedule an appointment.  Medications - No data to display  ED Discharge Orders        Ordered    guaiFENesin-codeine  100-10 MG/5ML syrup  3 times daily PRN     01/21/17 0981      Pertinent labs & imaging results that were available during my care of the patient were reviewed by me and considered in my medical decision making (see chart for details).    If controlled substance prescribed during this visit, 12 month history viewed on the [facility] prior to issuing an initial prescription for Schedule II or III opiod. ____________________________________________   FINAL CLINICAL IMPRESSION(S) / ED DIAGNOSES  Final diagnoses:  Viral URI with cough    Note:  This document was prepared using Dragon voice recognition software and may include unintentional dictation errors.     Victorino Dike, FNP 01/21/17 1132    Harvest Dark, MD 01/21/17 1436

## 2017-05-10 ENCOUNTER — Other Ambulatory Visit: Payer: Self-pay

## 2017-05-10 ENCOUNTER — Encounter: Payer: Self-pay | Admitting: Emergency Medicine

## 2017-05-10 ENCOUNTER — Emergency Department: Payer: Medicare Other

## 2017-05-10 ENCOUNTER — Emergency Department
Admission: EM | Admit: 2017-05-10 | Discharge: 2017-05-10 | Disposition: A | Discharge status: Home / Self Care | Payer: Medicare Other | Attending: Emergency Medicine | Admitting: Emergency Medicine

## 2017-05-10 DIAGNOSIS — S20212A Contusion of left front wall of thorax, initial encounter: Secondary | ICD-10-CM | POA: Diagnosis not present

## 2017-05-10 DIAGNOSIS — Y92410 Unspecified street and highway as the place of occurrence of the external cause: Secondary | ICD-10-CM | POA: Insufficient documentation

## 2017-05-10 DIAGNOSIS — E039 Hypothyroidism, unspecified: Secondary | ICD-10-CM | POA: Diagnosis not present

## 2017-05-10 DIAGNOSIS — Y9389 Activity, other specified: Secondary | ICD-10-CM | POA: Insufficient documentation

## 2017-05-10 DIAGNOSIS — Y999 Unspecified external cause status: Secondary | ICD-10-CM | POA: Diagnosis not present

## 2017-05-10 DIAGNOSIS — E119 Type 2 diabetes mellitus without complications: Secondary | ICD-10-CM | POA: Diagnosis not present

## 2017-05-10 DIAGNOSIS — I10 Essential (primary) hypertension: Secondary | ICD-10-CM | POA: Insufficient documentation

## 2017-05-10 DIAGNOSIS — Z7982 Long term (current) use of aspirin: Secondary | ICD-10-CM | POA: Insufficient documentation

## 2017-05-10 DIAGNOSIS — Z79899 Other long term (current) drug therapy: Secondary | ICD-10-CM | POA: Diagnosis not present

## 2017-05-10 DIAGNOSIS — S299XXA Unspecified injury of thorax, initial encounter: Secondary | ICD-10-CM | POA: Diagnosis present

## 2017-05-10 DIAGNOSIS — Z87891 Personal history of nicotine dependence: Secondary | ICD-10-CM | POA: Insufficient documentation

## 2017-05-10 LAB — CBC
HCT: 37.8 % (ref 35.0–47.0)
HEMOGLOBIN: 13 g/dL (ref 12.0–16.0)
MCH: 29.7 pg (ref 26.0–34.0)
MCHC: 34.4 g/dL (ref 32.0–36.0)
MCV: 86.3 fL (ref 80.0–100.0)
PLATELETS: 254 10*3/uL (ref 150–440)
RBC: 4.37 MIL/uL (ref 3.80–5.20)
RDW: 12.9 % (ref 11.5–14.5)
WBC: 6.1 10*3/uL (ref 3.6–11.0)

## 2017-05-10 LAB — BASIC METABOLIC PANEL
ANION GAP: 9 (ref 5–15)
BUN: 14 mg/dL (ref 6–20)
CALCIUM: 9 mg/dL (ref 8.9–10.3)
CO2: 30 mmol/L (ref 22–32)
CREATININE: 0.85 mg/dL (ref 0.44–1.00)
Chloride: 99 mmol/L — ABNORMAL LOW (ref 101–111)
Glucose, Bld: 95 mg/dL (ref 65–99)
Potassium: 2.8 mmol/L — ABNORMAL LOW (ref 3.5–5.1)
SODIUM: 138 mmol/L (ref 135–145)

## 2017-05-10 LAB — TROPONIN I

## 2017-05-10 MED ORDER — OXYCODONE-ACETAMINOPHEN 5-325 MG PO TABS: 1.0000 | ORAL_TABLET | ORAL | 0 refills | Status: DC | PRN

## 2017-05-10 MED ORDER — OXYCODONE-ACETAMINOPHEN 5-325 MG PO TABS
2.0000 | ORAL_TABLET | Freq: Once | ORAL | Status: AC
  Administered 2017-05-10: 2 via ORAL
  Filled 2017-05-10: qty 2
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
## 2017-05-10 NOTE — Discharge Instructions (Signed)
It was a pleasure to take care of you today, and thank you for coming to our emergency department.  If you have any questions or concerns before leaving please ask the nurse to grab me and I'm more than happy to go through your aftercare instructions again.  If you were prescribed any opioid pain medication today such as Norco, Vicodin, Percocet, morphine, hydrocodone, or oxycodone please make sure you do not drive when you are taking this medication as it can alter your ability to drive safely.  If you have any concerns once you are home that you are not improving or are in fact getting worse before you can make it to your follow-up appointment, please do not hesitate to call [phone] and come back for further evaluation.  Darel Hong, MD  Results for orders placed or performed during the hospital encounter of 42/70/62  Basic metabolic panel  Result Value Ref Range   Sodium 138 135 - 145 mmol/L   Potassium 2.8 (L) 3.5 - 5.1 mmol/L   Chloride 99 (L) 101 - 111 mmol/L   CO2 30 22 - 32 mmol/L   Glucose, Bld 95 65 - 99 mg/dL   BUN 14 6 - 20 mg/dL   Creatinine, Ser 0.85 0.44 - 1.00 mg/dL   Calcium 9.0 8.9 - 10.3 mg/dL   GFR calc non Af Amer >60 >60 mL/min   GFR calc Af Amer >60 >60 mL/min   Anion gap 9 5 - 15  CBC  Result Value Ref Range   WBC 6.1 3.6 - 11.0 K/uL   RBC 4.37 3.80 - 5.20 MIL/uL   Hemoglobin 13.0 12.0 - 16.0 g/dL   HCT 37.8 35.0 - 47.0 %   MCV 86.3 80.0 - 100.0 fL   MCH 29.7 26.0 - 34.0 pg   MCHC 34.4 32.0 - 36.0 g/dL   RDW 12.9 11.5 - 14.5 %   Platelets 254 150 - 440 K/uL  Troponin I  Result Value Ref Range   Troponin I <0.03 <0.03 ng/mL   Dg Chest 2 View  Result Date: 05/10/2017 CLINICAL DATA:  Left-sided chest pain extending into the back and left side of neck. EXAM: CHEST - 2 VIEW COMPARISON:  One-view chest x-ray 11/12/2013 FINDINGS: The heart size and mediastinal contours are within normal limits. Both lungs are clear. The visualized skeletal structures are  unremarkable. IMPRESSION: Negative two view chest x-ray Electronically Signed   By: San Morelle M.D.   On: 05/10/2017 20:23

## 2017-05-10 NOTE — ED Triage Notes (Signed)
Patient ambulatory to triage with steady gait, without difficulty or distress noted; pt reports left upper CP radiating into left side neck/shoulder/back with no accomp symptoms; st hx SVT

## 2017-05-10 NOTE — ED Notes (Signed)
Patient ambulatory to lobby with steady gait and NAD noted. Verbalized understanding of discharge instructions and follow-up care.  

## 2017-06-06 DIAGNOSIS — E119 Type 2 diabetes mellitus without complications: Secondary | ICD-10-CM | POA: Insufficient documentation

## 2018-03-07 ENCOUNTER — Emergency Department
Admission: EM | Admit: 2018-03-07 | Discharge: 2018-03-07 | Disposition: A | Discharge status: Home / Self Care | Payer: Medicare Other | Attending: Emergency Medicine | Admitting: Emergency Medicine

## 2018-03-07 ENCOUNTER — Encounter: Payer: Self-pay | Admitting: Emergency Medicine

## 2018-03-07 ENCOUNTER — Other Ambulatory Visit: Payer: Self-pay

## 2018-03-07 DIAGNOSIS — E119 Type 2 diabetes mellitus without complications: Secondary | ICD-10-CM | POA: Insufficient documentation

## 2018-03-07 DIAGNOSIS — E039 Hypothyroidism, unspecified: Secondary | ICD-10-CM | POA: Diagnosis not present

## 2018-03-07 DIAGNOSIS — F329 Major depressive disorder, single episode, unspecified: Secondary | ICD-10-CM | POA: Insufficient documentation

## 2018-03-07 DIAGNOSIS — R05 Cough: Secondary | ICD-10-CM | POA: Diagnosis present

## 2018-03-07 DIAGNOSIS — Z79899 Other long term (current) drug therapy: Secondary | ICD-10-CM | POA: Diagnosis not present

## 2018-03-07 DIAGNOSIS — J40 Bronchitis, not specified as acute or chronic: Secondary | ICD-10-CM | POA: Diagnosis not present

## 2018-03-07 DIAGNOSIS — F419 Anxiety disorder, unspecified: Secondary | ICD-10-CM | POA: Diagnosis not present

## 2018-03-07 DIAGNOSIS — I10 Essential (primary) hypertension: Secondary | ICD-10-CM | POA: Diagnosis not present

## 2018-03-07 MED ORDER — HYDROCOD POLST-CPM POLST ER 10-8 MG/5ML PO SUER: 5.0000 mL | Freq: Two times a day (BID) | ORAL | 0 refills | Status: DC

## 2018-03-07 MED ORDER — BENZONATATE 100 MG PO CAPS: 200.0000 mg | ORAL_CAPSULE | Freq: Three times a day (TID) | ORAL | 0 refills | Status: AC | PRN

## 2018-03-07 MED ORDER — AZITHROMYCIN 250 MG PO TABS: ORAL_TABLET | ORAL | 0 refills | Status: AC

## 2018-03-07 NOTE — Discharge Instructions (Signed)
Not take Tussionex while working.

## 2018-03-07 NOTE — ED Triage Notes (Signed)
Patient presents to ED via POV from home with c/o congestion and cough since Friday. Ambulatory to triage. Mask placed on patient.
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
## 2018-03-07 NOTE — ED Notes (Signed)
See triage note  Presents with cough,congestion and ear pain since Friday  Unsure of fever states she has been taking OTC meds for cold,and cough  Afebrile on arrival   States cough has been prod at times

## 2018-10-30 ENCOUNTER — Other Ambulatory Visit: Payer: Self-pay | Admitting: Student

## 2018-10-30 ENCOUNTER — Ambulatory Visit
Admission: RE | Admit: 2018-10-30 | Discharge: 2018-10-30 | Disposition: A | Discharge status: Home / Self Care | Payer: Medicare Other | Source: Ambulatory Visit | Attending: Student | Admitting: Student

## 2018-10-30 DIAGNOSIS — R2233 Localized swelling, mass and lump, upper limb, bilateral: Secondary | ICD-10-CM

## 2018-11-21 DIAGNOSIS — M79641 Pain in right hand: Secondary | ICD-10-CM | POA: Insufficient documentation

## 2019-03-13 ENCOUNTER — Emergency Department
Admission: EM | Admit: 2019-03-13 | Discharge: 2019-03-13 | Disposition: A | Discharge status: Home / Self Care | Payer: Medicare Other | Attending: Student in an Organized Health Care Education/Training Program | Admitting: Student in an Organized Health Care Education/Training Program

## 2019-03-13 ENCOUNTER — Other Ambulatory Visit: Payer: Self-pay

## 2019-03-13 ENCOUNTER — Encounter: Payer: Self-pay | Admitting: Emergency Medicine

## 2019-03-13 DIAGNOSIS — R519 Headache, unspecified: Secondary | ICD-10-CM | POA: Insufficient documentation

## 2019-03-13 DIAGNOSIS — J0100 Acute maxillary sinusitis, unspecified: Secondary | ICD-10-CM | POA: Diagnosis not present

## 2019-03-13 DIAGNOSIS — E119 Type 2 diabetes mellitus without complications: Secondary | ICD-10-CM | POA: Diagnosis not present

## 2019-03-13 DIAGNOSIS — Z7984 Long term (current) use of oral hypoglycemic drugs: Secondary | ICD-10-CM | POA: Diagnosis not present

## 2019-03-13 DIAGNOSIS — R0981 Nasal congestion: Secondary | ICD-10-CM | POA: Diagnosis present

## 2019-03-13 MED ORDER — TRAMADOL HCL 50 MG PO TABS: 50.0000 mg | ORAL_TABLET | Freq: Four times a day (QID) | ORAL | 0 refills | Status: DC | PRN

## 2019-03-13 MED ORDER — FEXOFENADINE-PSEUDOEPHED ER 60-120 MG PO TB12: 1.0000 | ORAL_TABLET | Freq: Two times a day (BID) | ORAL | 0 refills | Status: DC

## 2019-03-13 MED ORDER — AMOXICILLIN 875 MG PO TABS: 875.0000 mg | ORAL_TABLET | Freq: Two times a day (BID) | ORAL | 0 refills | Status: DC

## 2019-03-13 NOTE — ED Notes (Signed)
See triage note presents with sinus pressure since Saturday  Low grade fever on arrival

## 2019-03-13 NOTE — ED Triage Notes (Signed)
Pt in via POV, reports ongoing sinus pressure since Saturday, unrelieved with OTC medications.  Denies any other complaints.  Ambulatory to triage, NAD noted at this time.

## 2019-03-13 NOTE — ED Provider Notes (Signed)
[facility] Emergency Department Provider Note   ____________________________________________   First MD Initiated Contact with Patient 03/13/19 1723     (approximate)  I have reviewed the triage vital signs and the nursing notes.   HISTORY  Chief Complaint Sinusitis    HPI Carol Gomez is a 49 y.o. female patient presents with 1 week of sinus congestion left greater than right.  Patient also complain of ear pressure on the left side.  Patient has a postnasal drainage is causing her to have a sore throat.  Patient denies recent travel or known contact with COVID-19.  Denies nausea, vomiting, diarrhea.  Patient state frontal headache for 2 days.      Past Medical History:  Diagnosis Date  . Anemia   . Anxiety   . Arthritis   . Depression   . Diabetes mellitus without complication ([place])   . GERD (gastroesophageal reflux disease)   . History of hiatal hernia   . Hypertension   . Hypothyroidism   . Mechanical breakdown of electronic bone stimulator ([place])   . Migraine   . Pituitary tumor   . PTSD (post-traumatic stress disorder)   . Restless leg syndrome   . Sleep apnea    OSA-USE C-PAP  . Tachycardia     There are no problems to display for this patient.   Past Surgical History:  Procedure Laterality Date  . ABDOMINAL HYSTERECTOMY    . BREAST REDUCTION SURGERY Bilateral 2007  . CARPAL TUNNEL RELEASE Bilateral   . HEEL SPUR SURGERY Bilateral   . INTERSTIM IMPLANT PLACEMENT Right 01/28/2010   right hip ([employer])  . KNEE ARTHROSCOPY Right 04/28/2016   Procedure: ARTHROSCOPY KNEE;  Surgeon: Thornton Park, MD;  Location: [facility] ORS;  Service: Orthopedics;  Laterality: Right;  Extensive synovectomy and lysis of adhesions  . TRIGGER FINGER RELEASE Bilateral     Prior to Admission medications   Medication Sig Start Date End Date Taking? Authorizing Provider  amLODipine (NORVASC) 10 MG tablet Take 10 mg by mouth daily. for high blood  pressure 08/09/14   [provider]  amoxicillin (AMOXIL) 875 MG tablet Take 1 tablet (875 mg total) by mouth 2 (two) times daily. 03/13/19   Sable Feil, PA-C  baclofen (LIORESAL) 10 MG tablet Take 10 mg by mouth at bedtime.  10/21/14   [provider]  BELSOMRA 20 MG TABS Take 20 mg by mouth at bedtime.  10/21/14   [provider]  busPIRone (BUSPAR) 10 MG tablet Take 10 mg by mouth at bedtime. 04/05/16   [provider]  chlorpheniramine-HYDROcodone (TUSSIONEX PENNKINETIC ER) 10-8 MG/5ML SUER Take 5 mLs by mouth 2 (two) times daily. 03/07/18   Sable Feil, PA-C  DULoxetine (CYMBALTA) 60 MG capsule Take 60 mg by mouth at bedtime.  10/31/14   [provider]  fexofenadine-pseudoephedrine (ALLEGRA-D) 60-120 MG 12 hr tablet Take 1 tablet by mouth 2 (two) times daily. 03/13/19   Sable Feil, PA-C  gabapentin (NEURONTIN) 400 MG capsule Take 400 mg by mouth daily with supper. 04/13/16   [provider]  lactulose (CHRONULAC) 10 GM/15ML solution Take 5 g by mouth 2 (two) times daily. Takes 1/2 tablespoon.    [provider]  levothyroxine (SYNTHROID, LEVOTHROID) 100 MCG tablet Take 100 mcg by mouth daily before breakfast. 03/27/16   [provider]  metoprolol tartrate (LOPRESSOR) 25 MG tablet Take 25 mg by mouth daily. 04/06/16   [provider]  modafinil (PROVIGIL) 200  MG tablet Take 200 mg by mouth 2 (two) times daily.  10/28/14   [provider]  rOPINIRole (REQUIP) 0.25 MG tablet Take 0.5 tablets by mouth at bedtime.  09/06/14   [provider]  tiZANidine (ZANAFLEX) 4 MG tablet Take 4 mg by mouth at bedtime as needed for muscle spasms.  09/20/14   [provider]  traMADol (ULTRAM) 50 MG tablet Take 1 tablet (50 mg total) by mouth every 6 (six) hours as needed for moderate pain. 03/13/19   Sable Feil, PA-C  Vitamin D, Ergocalciferol, (DRISDOL) 50000 UNITS CAPS capsule Take 50,000 Units by mouth  2 (two) times a week. Monday and Thursday 10/26/14   [provider]    Allergies Dihydroergotamine  Family History  Problem Relation Age of Onset  . Hypertension Mother     Social History Social History   Tobacco Use  . Smoking status: Never Smoker  . Smokeless tobacco: Former Network engineer Use Topics  . Alcohol use: No  . Drug use: No    Review of Systems Constitutional: No fever/chills Eyes: No visual changes. ENT: No sore throat.  Nasal congestion and left ear pain. Cardiovascular: Denies chest pain. Respiratory: Denies shortness of breath. Gastrointestinal: No abdominal pain.  No nausea, no vomiting.  No diarrhea.  No constipation. Genitourinary: Negative for dysuria. Musculoskeletal: Negative for back pain. Skin: Negative for rash. Neurological: Positive for headaches, but denies focal weakness or numbness. Psychiatric:  Anxiety, depression, and PTSD. Endocrine:  Hypertension and hypothyroidism. Allergic/Immunilogical:  Dihdroergotamines  ____________________________________________   PHYSICAL EXAM:  VITAL SIGNS: ED Triage Vitals  Enc Vitals Group     BP 03/13/19 1718 (!) 165/101     Pulse Rate 03/13/19 1718 92     Resp 03/13/19 1718 19     Temp 03/13/19 1718 99.5 F (37.5 C)     Temp Source 03/13/19 1718 Oral     SpO2 03/13/19 1718 99 %     Weight 03/13/19 1713 172 lb (78 kg)     Height 03/13/19 1713 5\' 1"  (1.549 m)     Head Circumference --      Peak Flow --      Pain Score 03/13/19 1713 10     Pain Loc --      Pain Edu? --      Excl. in [place]? --    Constitutional: Alert and oriented. Well appearing and in no acute distress. Eyes: Conjunctivae are normal. PERRL. EOMI. Head: Atraumatic. Nose: Bilateral maxillary guarding.  Edematous nasal turbinates. Mouth/Throat: Mucous membranes are moist.  Oropharynx non-erythematous.  Postnasal drainage. Neck: No stridor.  No cervical spine tenderness to  palpation. Hematological/Lymphatic/Immunilogical: No cervical lymphadenopathy. Cardiovascular: Normal rate, regular rhythm. Grossly normal heart sounds.  Good peripheral circulation.  Elevated blood pressure Respiratory: Normal respiratory effort.  No retractions. Lungs CTAB. Musculoskeletal: No lower extremity tenderness nor edema.  No joint effusions. Neurologic:  Normal speech and language. No gross focal neurologic deficits are appreciated. No gait instability. Skin:  Skin is warm, dry and intact. No rash noted. Psychiatric: Mood and affect are normal. Speech and behavior are normal.  ____________________________________________   LABS (all labs ordered are listed, but only abnormal results are displayed)  Labs Reviewed - No data to display ____________________________________________  EKG   ____________________________________________  RADIOLOGY  ED MD interpretation:    Official radiology report(s): No results found.  ____________________________________________   PROCEDURES  Procedure(s) performed (including Critical Care):  Procedures   ____________________________________________   INITIAL  IMPRESSION / ASSESSMENT AND PLAN / ED COURSE  As part of my medical decision making, I reviewed the following data within the [place]     Patient presents 1 week of sinus congestion and left ear pain.  Physical exam is consistent with sinusitis.  Patient given discharge care instruction advised take medication as directed.  Patient advised follow-up PCP.    Carol Gomez was evaluated in Emergency Department on 03/13/2019 for the symptoms described in the history of present illness. She was evaluated in the context of the global COVID-19 pandemic, which necessitated consideration that the patient might be at risk for infection with the SARS-CoV-2 virus that causes COVID-19. Institutional protocols and algorithms that pertain to the evaluation of  patients at risk for COVID-19 are in a state of rapid change based on information released by regulatory bodies including the CDC and federal and state organizations. These policies and algorithms were followed during the patient's care in the ED.       ____________________________________________   FINAL CLINICAL IMPRESSION(S) / ED DIAGNOSES  Final diagnoses:  Subacute maxillary sinusitis     ED Discharge Orders         Ordered    fexofenadine-pseudoephedrine (ALLEGRA-D) 60-120 MG 12 hr tablet  2 times daily     03/13/19 1824    amoxicillin (AMOXIL) 875 MG tablet  2 times daily     03/13/19 1824    traMADol (ULTRAM) 50 MG tablet  Every 6 hours PRN     03/13/19 1824           Note:  This document was prepared using Dragon voice recognition software and may include unintentional dictation errors.    Sable Feil, PA-C 03/13/19 1825    Merlyn Lot, MD 03/13/19 [phone]

## 2019-09-17 ENCOUNTER — Ambulatory Visit: Payer: Self-pay

## 2019-12-14 IMAGING — CR DG HAND COMPLETE 3+V*R*
3 series · 3 of 3 positions shown · non-contrast
Comparison: None.

CLINICAL DATA: Bilateral hand swelling for 1 month.

EXAM:
RIGHT HAND - COMPLETE 3+ VIEW

[hand ap]
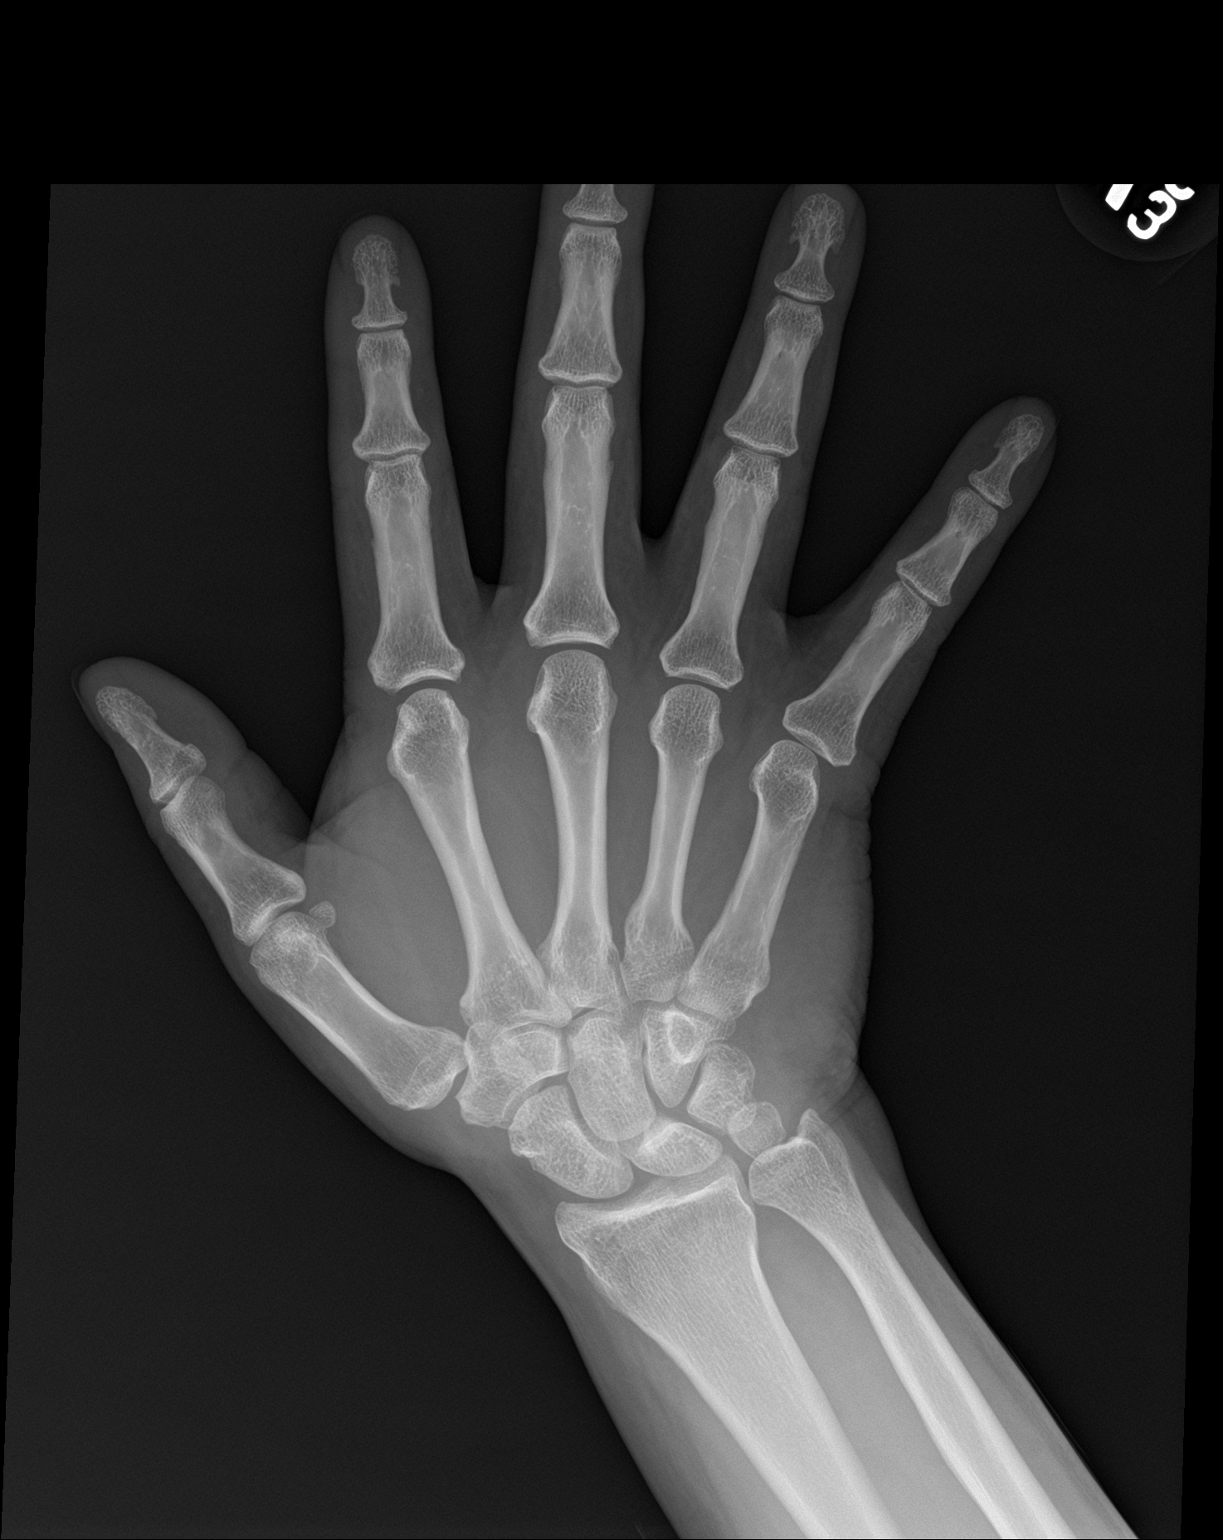

[hand obl]
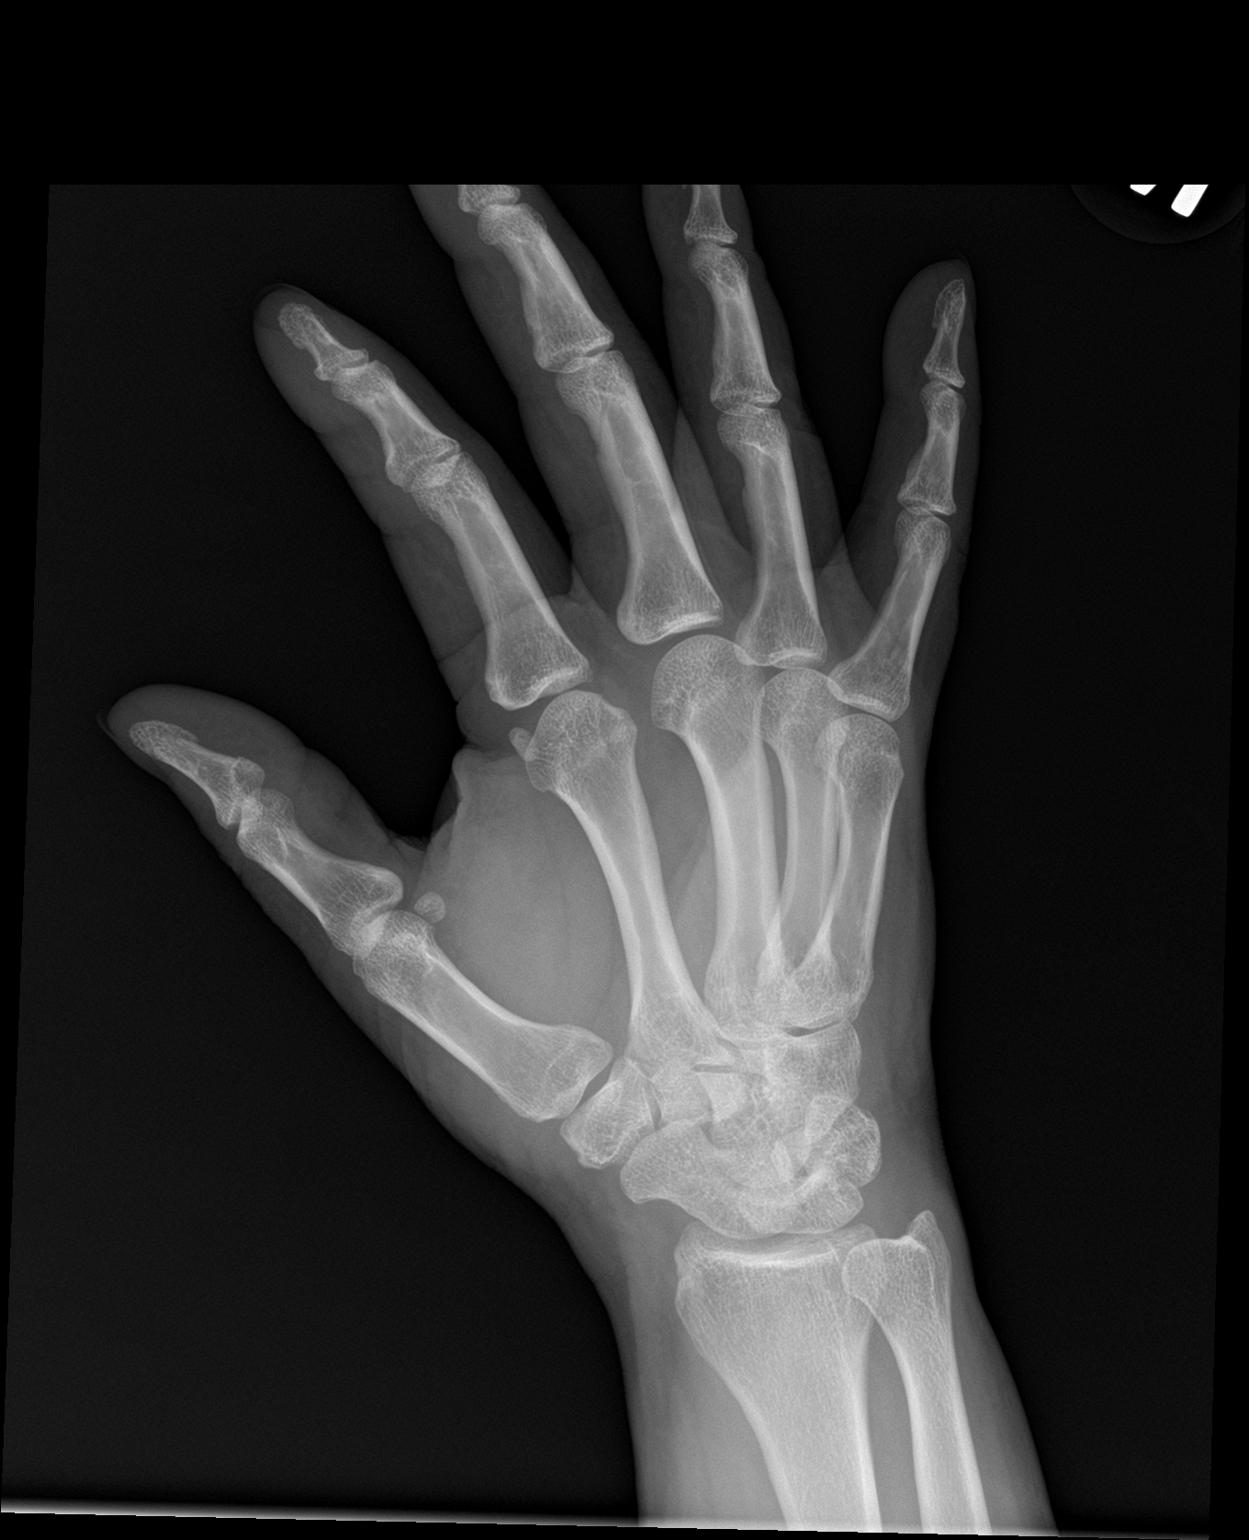

[hand lat]
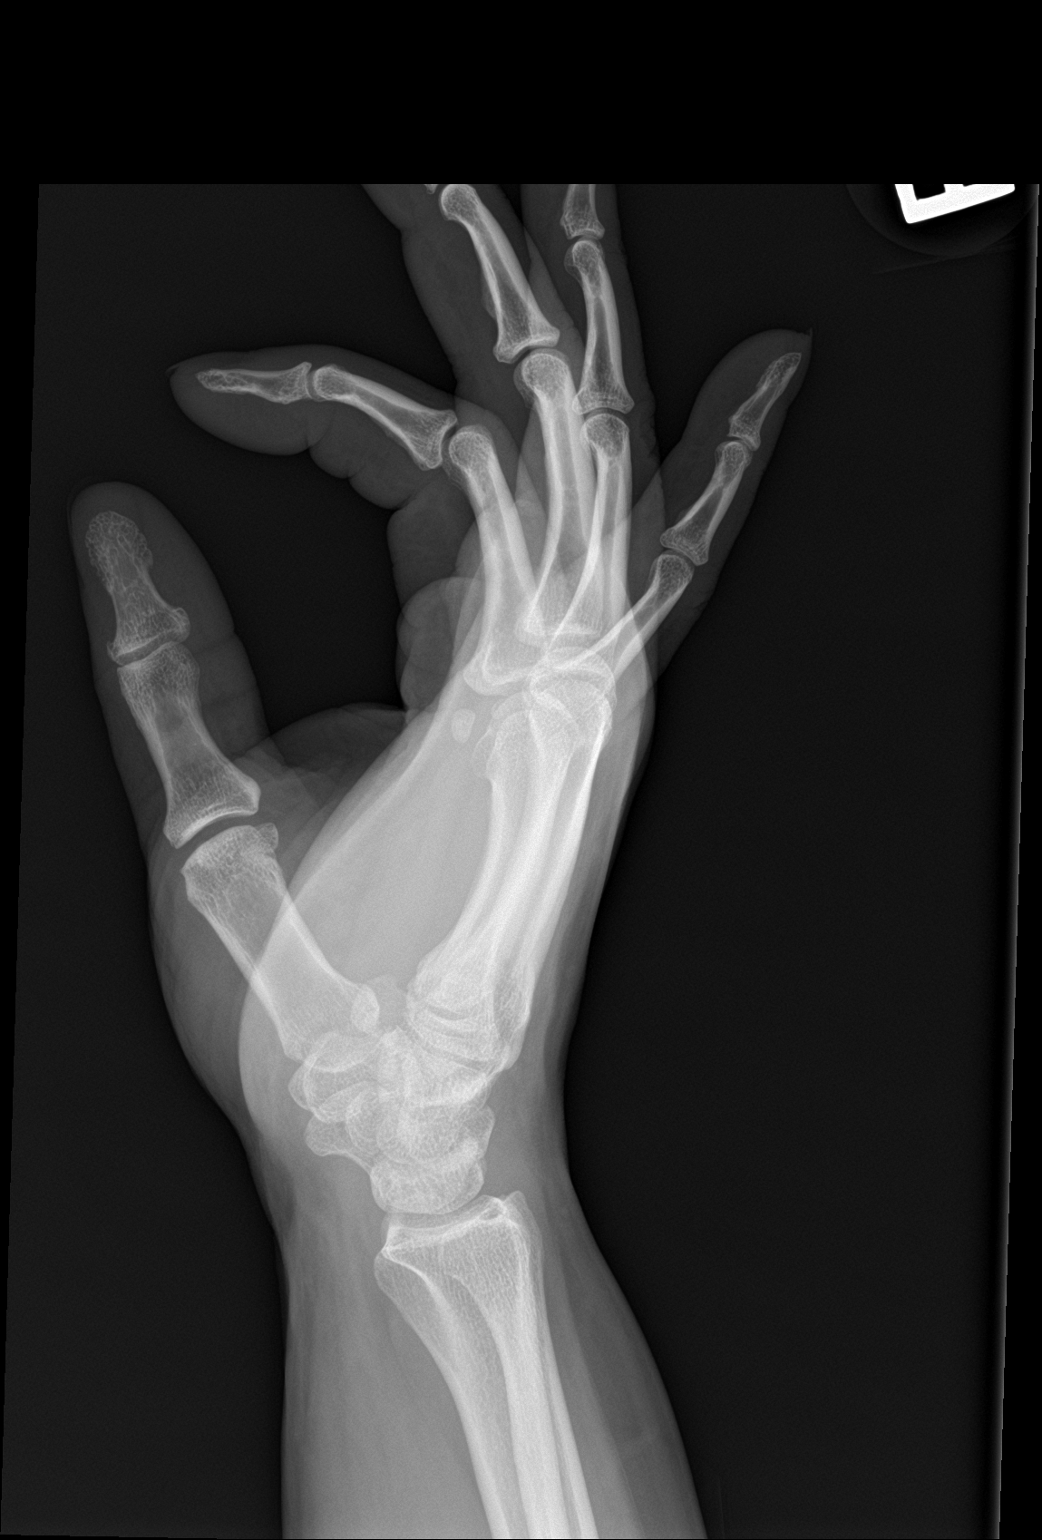

[3 of 3 positions shown; findings below may reference images not displayed]

FINDINGS: There is no evidence of fracture or dislocation. There is no
evidence of arthropathy or other focal bone abnormality. Soft
tissues are unremarkable.
IMPRESSION: Negative.

## 2021-12-31 ENCOUNTER — Emergency Department: Payer: Medicare Other

## 2021-12-31 ENCOUNTER — Other Ambulatory Visit: Payer: Self-pay

## 2021-12-31 ENCOUNTER — Emergency Department
Admission: EM | Admit: 2021-12-31 | Discharge: 2021-12-31 | Disposition: A | Discharge status: Home / Self Care | Payer: Medicare Other | Attending: Emergency Medicine | Admitting: Emergency Medicine

## 2021-12-31 DIAGNOSIS — I10 Essential (primary) hypertension: Secondary | ICD-10-CM | POA: Diagnosis not present

## 2021-12-31 DIAGNOSIS — M79641 Pain in right hand: Secondary | ICD-10-CM | POA: Insufficient documentation

## 2021-12-31 DIAGNOSIS — E119 Type 2 diabetes mellitus without complications: Secondary | ICD-10-CM | POA: Insufficient documentation

## 2021-12-31 DIAGNOSIS — E039 Hypothyroidism, unspecified: Secondary | ICD-10-CM | POA: Diagnosis not present

## 2021-12-31 MED ORDER — MELOXICAM 15 MG PO TABS: 15.0000 mg | ORAL_TABLET | Freq: Every day | ORAL | 11 refills | Status: DC

## 2021-12-31 NOTE — ED Notes (Signed)
Patient transported to X-ray 

## 2021-12-31 NOTE — ED Notes (Signed)
Wrist brace applied to right wrist with no issue. Pt tolerated well. Pt signed physical discharge form and ambulated to lobby.

## 2021-12-31 NOTE — ED Provider Notes (Signed)
[facility] Provider Note    Event Date/Time   First MD Initiated Contact with Patient 12/31/21 1257     (approximate)   History   Chief Complaint No chief complaint on file.   HPI Carol Gomez is a 51 y.o. female, history of hypertension, GERD, diabetes, depression, PTSD, anxiety, hypothyroidism, presents to the emergency department for evaluation of right hand/wrist pain x 1 week.  She states that she has been moving several pieces of fracture over the past few weeks, has developed significant pain along the distal end of her wrist and the base of her right hand.  She states that sometimes she feels the pain radiate to her upper arm.  Denies fever/chills, myalgias, chest pain, shortness of breath, rashes/lesions, dizziness/lightheadedness, weakness, or nausea/vomiting.  History Limitations: No limitations.        Physical Exam  Triage Vital Signs: ED Triage Vitals  Enc Vitals Group     BP 12/31/21 1245 (!) 154/104     Pulse Rate 12/31/21 1245 85     Resp 12/31/21 1245 18     Temp 12/31/21 1245 98.4 F (36.9 C)     Temp Source 12/31/21 1245 Oral     SpO2 12/31/21 1245 96 %     Weight 12/31/21 1231 173 lb (78.5 kg)     Height 12/31/21 1231 '5\' 1"'$  (1.549 m)     Head Circumference --      Peak Flow --      Pain Score 12/31/21 1231 10     Pain Loc --      Pain Edu? --      Excl. in [place]? --     Most recent vital signs: Vitals:   12/31/21 1245  BP: (!) 154/104  Pulse: 85  Resp: 18  Temp: 98.4 F (36.9 C)  SpO2: 96%    General: Awake, NAD.  Skin: Warm, dry. No rashes or lesions.  Eyes: PERRL. Conjunctivae normal.  CV: Good peripheral perfusion.  Resp: Normal effort.  Abd: Soft, non-tender. No distention.  Neuro: At baseline. No gross neurological deficits.  Musculoskeletal: Normal ROM of all extremities.  Focused Exam: Tenderness along the distal radius and the base of the first metacarpal on the right hand.  No surrounding  warmth, erythema, or swelling.  PMS intact distally.  Normal range of motion of the elbow and shoulder joint.  Physical Exam    ED Results / Procedures / Treatments  Labs (all labs ordered are listed, but only abnormal results are displayed) Labs Reviewed - No data to display   EKG N/A.    RADIOLOGY  ED Provider Interpretation: I personally reviewed and interpreted this x-ray, no signs of fractures.  DG Hand Complete Right  Result Date: 12/31/2021 CLINICAL DATA:  Right hand and wrist pain for week after moving furniture. EXAM: RIGHT HAND - COMPLETE 3+ VIEW COMPARISON:  10/30/2018 FINDINGS: Mild spurring at the first carpometacarpal articulation compatible with degenerative arthropathy. No fracture or acute bony finding. No definite soft tissue abnormality. IMPRESSION: 1. Mild degenerative arthropathy at the first carpometacarpal articulation. No acute findings. If symptoms persist despite conservative therapy, MRI may be warranted for further characterization. Electronically Signed   By: Van Clines M.D.   On: 12/31/2021 13:58    PROCEDURES:  Critical Care performed: N/A.  Procedures    MEDICATIONS ORDERED IN ED: Medications - No data to display   IMPRESSION / MDM / ASSESSMENT AND PLAN / ED COURSE  I reviewed the  triage vital signs and the nursing notes.                              Differential diagnosis includes, but is not limited to, wrist sprain, de Quervain tenosynovitis, osteoarthritis, rheumatoid arthritis, carpal dislocation, distal radius fracture.  Assessment/Plan Patient presents with hand/wrist pain x 1 week in the setting of repetitive moving of furniture.  On physical exam she does have some tenderness along the distal radius as it meets the base of the first metacarpal.  She is neurovascular intact.  X-ray shows signs of degenerative arthropathy.  No signs of any infectious etiology.  Provide her with a wrist brace and pain medication.  Provide her  with a referral to orthopedics as well to follow-up with if her symptoms do not improve.  She was amenable to this.  Will discharge.  Provided the patient with anticipatory guidance, return precautions, and educational material. Encouraged the patient to return to the emergency department at any time if they begin to experience any new or worsening symptoms. Patient expressed understanding and agreed with the plan.   Patient's presentation is most consistent with acute complicated illness / injury requiring diagnostic workup.       FINAL CLINICAL IMPRESSION(S) / ED DIAGNOSES   Final diagnoses:  Right hand pain     Rx / DC Orders   ED Discharge Orders          Ordered    meloxicam (MOBIC) 15 MG tablet  Daily        12/31/21 1422             Note:  This document was prepared using Dragon voice recognition software and may include unintentional dictation errors.   Teodoro Spray, [place] 12/31/21 [place]    Delman Kitten, MD 01/01/22 1606

## 2021-12-31 NOTE — ED Triage Notes (Signed)
Pt in with co right hand and right wrist pain for a week. States started after moving furniture.

## 2021-12-31 NOTE — Discharge Instructions (Addendum)
-  Your x-ray suggest that you may have osteoarthritis in the affected hand.  Please review the educational material.  Please avoid strenuous activities to prevent worsening of your symptoms.  In the meantime, utilize the wrist brace and the medication prescribed to help manage her symptoms.  -You may take the meloxicam as needed for pain.  You may additionally take acetaminophen on top of this.  Utilize the wrist brace as needed.  -If your symptoms persist beyond 1 month, please follow-up with the orthopedist listed in these instructions.  -Return to the emergency department anytime if you begin to experience any new or worsening symptoms.

## 2022-03-03 ENCOUNTER — Other Ambulatory Visit: Payer: Self-pay | Admitting: Obstetrics and Gynecology

## 2022-03-03 DIAGNOSIS — M25541 Pain in joints of right hand: Secondary | ICD-10-CM

## 2022-03-15 ENCOUNTER — Ambulatory Visit
Admission: RE | Admit: 2022-03-15 | Discharge: 2022-03-15 | Disposition: A | Discharge status: Home / Self Care | Payer: 59 | Source: Ambulatory Visit | Attending: Obstetrics and Gynecology | Admitting: Obstetrics and Gynecology

## 2022-03-15 DIAGNOSIS — M25541 Pain in joints of right hand: Secondary | ICD-10-CM | POA: Insufficient documentation

## 2022-06-18 NOTE — Progress Notes (Signed)
Sleep Medicine   Office Visit  Patient Name: Carol Gomez DOB: Jul 25, 1970 MRN [id number]    Chief Complaint: sleep evaluation  Brief History:  Carol Gomez presents for an initial consult for sleep evaluation and to establish care. Patient has a at least 19 year history of sleep apnea. Patient used to have a cpap machine but it broke down at least 6 months ago and patient has not been able to use it. Sleep quality is poor. This is noted every night. The patient's bed partner reports  snoring and gasping at night. The patient relates the following symptoms: headaches, trouble concentrating, brain fogginess and fatigue are also present. The patient goes to sleep at 0800 pm and wakes up at 0500 am and will wake up several times in between and will have trouble returning to sleep. Sleep quality is the same when outside home environment.  Patient has noted significant movement of her legs at night that would disrupt her sleep and reports having a history of restless leg syndrome.  The patient  relates sleep talking unusual behavior during the night.  The patient relates PTSD as a history of psychiatric problems. The Epworth Sleepiness Score is 16 out of 24 .  The patient relates  Cardiovascular risk factors include: hypertension.     ROS  General: (-) fever, (-) chills, (-) night sweat Nose and Sinuses: (-) nasal stuffiness or itchiness, (-) postnasal drip, (-) nosebleeds, (-) sinus trouble. Mouth and Throat: (-) sore throat, (-) hoarseness. Neck: (-) swollen glands, (-) enlarged thyroid, (-) neck pain. Respiratory: - cough, - shortness of breath, - wheezing. Neurologic: - numbness, - tingling. Psychiatric: + anxiety, + depression Sleep behavior: -sleep paralysis -hypnogogic hallucinations -dream enactment      -vivid dreams -cataplexy -night terrors -sleep walking   Current Medication: Outpatient Encounter Medications as of 06/21/2022  Medication Sig   amLODipine (NORVASC) 5 MG tablet Take  5 mg by mouth daily.   baclofen (LIORESAL) 10 MG tablet Take by mouth.   Eszopiclone 3 MG TABS Take 3 mg by mouth at bedtime as needed.   gabapentin (NEURONTIN) 600 MG tablet Take 600 mg by mouth 3 (three) times daily.   lactulose (CHRONULAC) 10 GM/15ML solution Take by mouth.   levothyroxine (SYNTHROID) 88 MCG tablet Take 88 mcg by mouth daily.   metFORMIN (GLUCOPHAGE-XR) 500 MG 24 hr tablet Take by mouth.   omeprazole (PRILOSEC) 20 MG capsule Take 20 mg by mouth 2 (two) times daily.   rosuvastatin (CRESTOR) 10 MG tablet Take 10 mg by mouth at bedtime.   busPIRone (BUSPAR) 10 MG tablet Take 10 mg by mouth at bedtime.   carvedilol (COREG) 12.5 MG tablet Take by mouth.   Cholecalciferol (VITAMIN D3) 25 MCG (1000 UT) CAPS Take 3 capsules by mouth daily.   desipramine (NORPRAMIN) 25 MG tablet Take 25 mg by mouth at bedtime.   diclofenac Sodium (VOLTAREN) 1 % GEL    DULoxetine (CYMBALTA) 60 MG capsule Take 60 mg by mouth at bedtime.    hydrALAZINE (APRESOLINE) 50 MG tablet hydralazine 50 mg tablet  take 1 tablet by mouth three times a day   hydrochlorothiazide (HYDRODIURIL) 25 MG tablet Take 1 tablet by mouth daily.   lidocaine (LIDODERM) 5 % 1 patch daily.   lisinopril-hydrochlorothiazide (ZESTORETIC) 20-25 MG tablet Take 1 tablet by mouth daily.   meloxicam (MOBIC) 15 MG tablet Take 1 tablet (15 mg total) by mouth daily.   metoprolol succinate (TOPROL-XL) 100 MG 24 hr tablet  mirtazapine (REMERON SOL-TAB) 15 MG disintegrating tablet Take 15 mg by mouth at bedtime.   modafinil (PROVIGIL) 200 MG tablet Take 200 mg by mouth 2 (two) times daily.    rOPINIRole (REQUIP) 0.25 MG tablet Take 0.5 tablets by mouth at bedtime.    spironolactone (ALDACTONE) 25 MG tablet Take 25 mg by mouth daily.   traMADol (ULTRAM) 50 MG tablet Take 1 tablet (50 mg total) by mouth every 6 (six) hours as needed for moderate pain.   [DISCONTINUED] amLODipine (NORVASC) 10 MG tablet Take 10 mg by mouth daily. for high  blood pressure   [DISCONTINUED] amoxicillin (AMOXIL) 875 MG tablet Take 1 tablet (875 mg total) by mouth 2 (two) times daily.   [DISCONTINUED] baclofen (LIORESAL) 10 MG tablet Take 10 mg by mouth at bedtime.    [DISCONTINUED] BELSOMRA 20 MG TABS Take 20 mg by mouth at bedtime.    [DISCONTINUED] chlorpheniramine-HYDROcodone (TUSSIONEX PENNKINETIC ER) 10-8 MG/5ML SUER Take 5 mLs by mouth 2 (two) times daily.   [DISCONTINUED] fexofenadine-pseudoephedrine (ALLEGRA-D) 60-120 MG 12 hr tablet Take 1 tablet by mouth 2 (two) times daily.   [DISCONTINUED] gabapentin (NEURONTIN) 400 MG capsule Take 400 mg by mouth daily with supper.   [DISCONTINUED] lactulose (CHRONULAC) 10 GM/15ML solution Take 5 g by mouth 2 (two) times daily. Takes 1/2 tablespoon.   [DISCONTINUED] levothyroxine (SYNTHROID, LEVOTHROID) 100 MCG tablet Take 100 mcg by mouth daily before breakfast.   [DISCONTINUED] metoprolol tartrate (LOPRESSOR) 25 MG tablet Take 25 mg by mouth daily.   [DISCONTINUED] tiZANidine (ZANAFLEX) 4 MG tablet Take 4 mg by mouth at bedtime as needed for muscle spasms.    [DISCONTINUED] Vitamin D, Ergocalciferol, (DRISDOL) 50000 UNITS CAPS capsule Take 50,000 Units by mouth 2 (two) times a week. Monday and Thursday   No facility-administered encounter medications on file as of 06/21/2022.    Surgical History: Past Surgical History:  Procedure Laterality Date   ABDOMINAL HYSTERECTOMY     BREAST REDUCTION SURGERY Bilateral 2007   CARPAL TUNNEL RELEASE Bilateral    HEEL SPUR SURGERY Bilateral    INTERSTIM IMPLANT PLACEMENT Right 01/28/2010   right hip ([employer])   KNEE ARTHROSCOPY Right 04/28/2016   Procedure: ARTHROSCOPY KNEE;  Surgeon: Juanell Fairly, MD;  Location: [facility] ORS;  Service: Orthopedics;  Laterality: Right;  Extensive synovectomy and lysis of adhesions   TRIGGER FINGER RELEASE Bilateral     Medical History: Past Medical History:  Diagnosis Date   Anemia    Anxiety    Arthritis    Depression     Diabetes mellitus without complication (HCC)    GERD (gastroesophageal reflux disease)    History of hiatal hernia    Hypertension    Hypothyroidism    Mechanical breakdown of electronic bone stimulator (HCC)    Migraine    Pituitary tumor    PTSD (post-traumatic stress disorder)    Restless leg syndrome    Sleep apnea    OSA-USE C-PAP   Tachycardia     Family History: Non contributory to the present illness  Social History: Social History   Socioeconomic History   Marital status: Single    Spouse name: Not on file   Number of children: Not on file   Years of education: Not on file   Highest education level: Not on file  Occupational History   Not on file  Tobacco Use   Smoking status: Never   Smokeless tobacco: Former  Building services engineer Use: Never used  Substance and Sexual  Activity   Alcohol use: No   Drug use: No   Sexual activity: Not on file  Other Topics Concern   Not on file  Social History Narrative   Not on file   Social Determinants of Health   Financial Resource Strain: Not on file  Food Insecurity: Not on file  Transportation Needs: Not on file  Physical Activity: Not on file  Stress: Not on file  Social Connections: Not on file  Intimate Partner Violence: Not on file    Vital Signs: Blood pressure (!) 148/91, pulse (!) 109, resp. rate 16, height 5\' 1"  (1.549 m), weight 189 lb (85.7 kg), SpO2 99 %. Body mass index is 35.71 kg/m.   Examination: General Appearance: The patient is well-developed, well-nourished, and in no distress. Neck Circumference: 38 cm  Skin: Gross inspection of skin unremarkable. Head: normocephalic, no gross deformities. Eyes: no gross deformities noted. ENT: ears appear grossly normal Neurologic: Alert and oriented. No involuntary movements.    STOP BANG RISK ASSESSMENT S (snore) Have you been told that you snore?     YES   T (tired) Are you often tired, fatigued, or sleepy during the day?   YES  O  (obstruction) Do you stop breathing, choke, or gasp during sleep? YES   P (pressure) Do you have or are you being treated for high blood pressure? YES   B (BMI) Is your body index greater than 35 kg/m? NO   A (age) Are you 73 years old or older? NO   N (neck) Do you have a neck circumference greater than 16 inches?   NO   G (gender) Are you a female? NO   TOTAL STOP/BANG "YES" ANSWERS 4                                                               A STOP-Bang score of 2 or less is considered low risk, and a score of 5 or more is high risk for having either moderate or severe OSA. For people who score 3 or 4, doctors may need to perform further assessment to determine how likely they are to have OSA.         EPWORTH SLEEPINESS SCALE:  Scale:  (0)= no chance of dozing; (1)= slight chance of dozing; (2)= moderate chance of dozing; (3)= high chance of dozing  Chance  Situtation    Sitting and reading: 2    Watching TV: 3    Sitting Inactive in public: 2    As a passenger in car: 3      Lying down to rest: 3    Sitting and talking: 1    Sitting quielty after lunch: 2    In a car, stopped in traffic: 3   TOTAL SCORE:   16 out of 24    SLEEP STUDIES:  None   LABS: No results found for this or any previous visit (from the past 2160 hour(s)).  Radiology: MR HAND RIGHT WO CONTRAST  Result Date: 03/23/2022 CLINICAL DATA:  Base of thumb pain for 3 months.  No known injury. EXAM: MRI OF THE RIGHT HAND WITHOUT CONTRAST TECHNIQUE: Multiplanar, multisequence MR imaging of the right hand was performed. No intravenous contrast was administered. Initial images obtained on 03/15/2022 were obtained  with a large field of view including the entire hand. The patient was returned for small field-of-view imaging of the thumb on 03/23/2022. COMPARISON:  Radiographs 12/31/2021 and 10/30/2018. FINDINGS: Bones/Joint/Cartilage No evidence of acute fracture, dislocation or significant joint  effusion. Minimal degenerative changes at the 1st carpometacarpal articulation. The carpal bones appear normally aligned. Ligaments The ulnar collateral ligament of the 1st metacarpal phalangeal joint appears somewhat indistinct, but intact. The radial collateral ligament appears normal. The collateral ligaments of the other metacarpal phalangeal joints appear normal. Muscles and Tendons Unremarkable.  The thumb muscles and tendons appear normal. Soft tissues No periarticular fluid collection, foreign body or significant inflammation identified. IMPRESSION: 1. The ulnar collateral ligament of the 1st metacarpophalangeal joint appears somewhat indistinct, but intact, possibly related to previous partial tear. Correlate clinically. 2. No acute osseous findings. The hand muscles and tendons appear unremarkable. 3. Minimal degenerative changes at the 1st carpometacarpal articulation. Electronically Signed   By: Carey Bullocks M.D.   On: 03/23/2022 12:04    No results found.  No results found.    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 OSA (obstructive sleep apnea) PLAN OSA:   Patient evaluation suggests high risk of sleep disordered breathing due to history of OSA, daytime sleepiness, snoring, gasping.  Patient has comorbid cardiovascular risk factors including:  hypertension which could be exacerbated by pathologic sleep-disordered breathing.  Suggest: PSG to assess/treat the patient's sleep disordered breathing. The patient was also counselled on weight loss to optimize sleep health.  2. Hypersomnia Will monitor- she is on provigil- if symptoms fail to improve after resuming cpap, we will consider MSLT.      General Counseling: I have discussed the findings of the evaluation and examination with Carol Gomez.  I have also discussed any further diagnostic evaluation thatmay be needed or ordered today. Carol Gomez verbalizes understanding of the findings of todays visit. We also reviewed her medications today and discussed drug interactions and side effects including but not limited excessive drowsiness and altered mental states. We also discussed that there is always a risk not just to her but also people around her. she has been encouraged to call the office with any questions or concerns that should arise related to todays visit.  No orders of the defined types were placed in this encounter.       I have personally obtained a history, evaluated the patient, evaluated pertinent data, formulated the assessment and plan and placed orders.   This patient was seen today by Emmaline Kluver, PA-C in collaboration with Dr. Freda Munro.   Yevonne Pax, MD [facility] Diplomate ABMS Pulmonary and Critical Care Medicine Sleep medicine

## 2022-06-21 ENCOUNTER — Ambulatory Visit (INDEPENDENT_AMBULATORY_CARE_PROVIDER_SITE_OTHER): Payer: 59 | Admitting: Internal Medicine

## 2022-06-21 VITALS — BP 148/91 | HR 109 | Resp 16 | Ht 61.0 in | Wt 189.0 lb

## 2022-06-21 DIAGNOSIS — G4733 Obstructive sleep apnea (adult) (pediatric): Secondary | ICD-10-CM | POA: Diagnosis not present

## 2022-06-21 DIAGNOSIS — E038 Other specified hypothyroidism: Secondary | ICD-10-CM | POA: Insufficient documentation

## 2022-06-21 DIAGNOSIS — G4710 Hypersomnia, unspecified: Secondary | ICD-10-CM

## 2022-09-27 ENCOUNTER — Other Ambulatory Visit: Payer: Self-pay

## 2022-09-27 ENCOUNTER — Emergency Department
Admission: EM | Admit: 2022-09-27 | Discharge: 2022-09-27 | Disposition: A | Discharge status: Home / Self Care | Payer: 59 | Attending: Emergency Medicine | Admitting: Emergency Medicine

## 2022-09-27 DIAGNOSIS — E119 Type 2 diabetes mellitus without complications: Secondary | ICD-10-CM | POA: Diagnosis not present

## 2022-09-27 DIAGNOSIS — R0981 Nasal congestion: Secondary | ICD-10-CM | POA: Diagnosis present

## 2022-09-27 DIAGNOSIS — E039 Hypothyroidism, unspecified: Secondary | ICD-10-CM | POA: Insufficient documentation

## 2022-09-27 DIAGNOSIS — U071 COVID-19: Secondary | ICD-10-CM | POA: Insufficient documentation

## 2022-09-27 DIAGNOSIS — I10 Essential (primary) hypertension: Secondary | ICD-10-CM | POA: Insufficient documentation

## 2022-09-27 LAB — SARS CORONAVIRUS 2 BY RT PCR: SARS Coronavirus 2 by RT PCR: POSITIVE — AB

## 2022-09-27 MED ORDER — BENZONATATE 200 MG PO CAPS: 200.0000 mg | ORAL_CAPSULE | Freq: Three times a day (TID) | ORAL | 0 refills | Status: AC | PRN

## 2022-09-27 NOTE — ED Triage Notes (Signed)
Pt reports head congestion started Saturday. NAD noted Did not take bp meds today

## 2022-09-27 NOTE — Discharge Instructions (Addendum)
Call your primary care provider if any continued problems.  Return to the emergency department if any severe worsening of your symptoms especially if you develop any shortness of breath or difficulty breathing. Blood pressure today was elevated in the emergency department and you should continue taking your blood pressure medication every day.  Have your primary care provider recheck your blood pressure.

## 2022-09-27 NOTE — ED Provider Notes (Signed)
[facility] Provider Note    Event Date/Time   First MD Initiated Contact with Patient 09/27/22 1320     (approximate)   History   No chief complaint on file.   HPI  Carol Gomez is a 52 y.o. female presents to the ED with complaint of head congestion that started Saturday with bodyaches and cough.  Patient has a history of diabetes, hypertension, migraine, hypothyroidism and sleep apnea.  Patient did have the original COVID-vaccine in the past.      Physical Exam   Triage Vital Signs: ED Triage Vitals  Encounter Vitals Group     BP 09/27/22 1239 (!) 183/98     Systolic BP Percentile --      Diastolic BP Percentile --      Pulse Rate 09/27/22 1239 92     Resp 09/27/22 1239 20     Temp 09/27/22 1239 98.9 F (37.2 C)     Temp src --      SpO2 09/27/22 1239 99 %     Weight 09/27/22 1240 176 lb (79.8 kg)     Height 09/27/22 1240 5\' 1"  (1.549 m)     Head Circumference --      Peak Flow --      Pain Score 09/27/22 1239 8     Pain Loc --      Pain Education --      Exclude from Growth Chart --     Most recent vital signs: Vitals:   09/27/22 1239 09/27/22 1342  BP: (!) 183/98 (!) 151/99  Pulse: 92 90  Resp: 20 20  Temp: 98.9 F (37.2 C) 100 F (37.8 C)  SpO2: 99%      General: Awake, no distress.  Positive nasal congestion. CV:  Good peripheral perfusion.  Resp:  Normal effort.  Lungs are clear bilaterally. Abd:  No distention.  Other:     ED Results / Procedures / Treatments   Labs (all labs ordered are listed, but only abnormal results are displayed) Labs Reviewed  SARS CORONAVIRUS 2 BY RT PCR - Abnormal; Notable for the following components:      Result Value   SARS Coronavirus 2 by RT PCR POSITIVE (*)    All other components within normal limits      PROCEDURES:  Critical Care performed:   Procedures   MEDICATIONS ORDERED IN ED: Medications - No data to display   IMPRESSION / MDM / ASSESSMENT AND PLAN  / ED COURSE  I reviewed the triage vital signs and the nursing notes.   Differential diagnosis includes, but is not limited to, COVID, viral illness, seasonal allergies, upper respiratory infection.  52 year old female presents to the ED with recent onset of upper respiratory symptoms and feeling feverish.  Patient tested positive for COVID and was made aware.  A note was written for her to remain out of work for 5 days after the onset of her symptoms.  A prescription for Tessalon Perles was sent to the pharmacy to take as needed for cough as she has taken over-the-counter medication with minimal improvement.  She is strongly encouraged to drink fluids to stay hydrated and return to the emergency department if any severe worsening of her symptoms.      Patient's presentation is most consistent with acute complicated illness / injury requiring diagnostic workup.  FINAL CLINICAL IMPRESSION(S) / ED DIAGNOSES   Final diagnoses:  COVID     Rx / DC Orders  ED Discharge Orders          Ordered    benzonatate (TESSALON) 200 MG capsule  3 times daily PRN        09/27/22 1337             Note:  This document was prepared using Dragon voice recognition software and may include unintentional dictation errors.   Tommi Rumps, PA-C 09/27/22 1346    Trinna Post, MD 09/28/22 [phone]

## 2023-03-11 NOTE — Progress Notes (Signed)
 No show

## 2023-03-14 ENCOUNTER — Ambulatory Visit: Admitting: Internal Medicine
